# Patient Record
Sex: Female | Born: 1945 | Race: Black or African American | Hispanic: No | Marital: Married | Smoking: Former smoker
Health system: Southern US, Community
[De-identification: ages and names within clinical notes are randomized; demographics above are authoritative.]

## PROBLEM LIST (undated history)

## (undated) DIAGNOSIS — Z8619 Personal history of other infectious and parasitic diseases: Secondary | ICD-10-CM

## (undated) DIAGNOSIS — G5600 Carpal tunnel syndrome, unspecified upper limb: Secondary | ICD-10-CM

## (undated) DIAGNOSIS — Z86010 Personal history of colonic polyps: Secondary | ICD-10-CM

## (undated) DIAGNOSIS — B029 Zoster without complications: Secondary | ICD-10-CM

## (undated) DIAGNOSIS — E079 Disorder of thyroid, unspecified: Secondary | ICD-10-CM

## (undated) DIAGNOSIS — E559 Vitamin D deficiency, unspecified: Secondary | ICD-10-CM

## (undated) DIAGNOSIS — D219 Benign neoplasm of connective and other soft tissue, unspecified: Secondary | ICD-10-CM

## (undated) DIAGNOSIS — I10 Essential (primary) hypertension: Secondary | ICD-10-CM

## (undated) DIAGNOSIS — J349 Unspecified disorder of nose and nasal sinuses: Secondary | ICD-10-CM

## (undated) DIAGNOSIS — J45909 Unspecified asthma, uncomplicated: Secondary | ICD-10-CM

## (undated) DIAGNOSIS — E119 Type 2 diabetes mellitus without complications: Secondary | ICD-10-CM

## (undated) DIAGNOSIS — B019 Varicella without complication: Secondary | ICD-10-CM

## (undated) HISTORY — DX: Varicella without complication: B01.9

## (undated) HISTORY — DX: Hypercalcemia: E83.52

## (undated) HISTORY — PX: TUBAL LIGATION: SHX77

## (undated) HISTORY — PX: OTHER SURGICAL HISTORY: SHX169

## (undated) HISTORY — DX: Unspecified disorder of nose and nasal sinuses: J34.9

## (undated) HISTORY — PX: EYE SURGERY: SHX253

## (undated) HISTORY — DX: Vitamin D deficiency, unspecified: E55.9

## (undated) HISTORY — DX: Disorder of thyroid, unspecified: E07.9

## (undated) HISTORY — DX: Carpal tunnel syndrome, unspecified upper limb: G56.00

## (undated) HISTORY — DX: Zoster without complications: B02.9

## (undated) HISTORY — DX: Unspecified asthma, uncomplicated: J45.909

## (undated) HISTORY — PX: CATARACT EXTRACTION: SUR2

## (undated) HISTORY — DX: Type 2 diabetes mellitus without complications: E11.9

## (undated) HISTORY — DX: Essential (primary) hypertension: I10

## (undated) HISTORY — DX: Personal history of colonic polyps: Z86.010

## (undated) HISTORY — DX: Personal history of other infectious and parasitic diseases: Z86.19

## (undated) HISTORY — DX: Benign neoplasm of connective and other soft tissue, unspecified: D21.9

---

## 2004-06-03 ENCOUNTER — Ambulatory Visit: Payer: Self-pay | Admitting: Orthopaedic Surgery

## 2005-02-23 ENCOUNTER — Ambulatory Visit: Payer: Self-pay | Admitting: Family Medicine

## 2006-06-08 ENCOUNTER — Ambulatory Visit: Payer: Self-pay | Admitting: Family Medicine

## 2006-06-20 ENCOUNTER — Ambulatory Visit: Payer: Self-pay | Admitting: Family Medicine

## 2007-08-18 ENCOUNTER — Ambulatory Visit: Payer: Self-pay | Admitting: General Surgery

## 2007-12-04 ENCOUNTER — Ambulatory Visit: Payer: Self-pay

## 2008-09-23 ENCOUNTER — Inpatient Hospital Stay: Payer: Self-pay | Admitting: Specialist

## 2008-09-23 ENCOUNTER — Other Ambulatory Visit: Payer: Self-pay

## 2008-09-27 ENCOUNTER — Ambulatory Visit: Payer: Self-pay | Admitting: Internal Medicine

## 2009-01-01 ENCOUNTER — Ambulatory Visit: Payer: Self-pay

## 2011-10-22 ENCOUNTER — Ambulatory Visit: Payer: Self-pay | Admitting: Family Medicine

## 2013-04-11 ENCOUNTER — Encounter: Payer: Self-pay | Admitting: Podiatry

## 2013-04-16 ENCOUNTER — Ambulatory Visit: Payer: Self-pay | Admitting: Podiatry

## 2014-07-05 LAB — HM DEXA SCAN: HM DEXA SCAN: NORMAL

## 2014-07-05 LAB — HM PAP SMEAR: HM PAP: NEGATIVE

## 2014-09-13 ENCOUNTER — Other Ambulatory Visit: Payer: Self-pay | Admitting: Family Medicine

## 2015-02-05 DIAGNOSIS — H524 Presbyopia: Secondary | ICD-10-CM | POA: Diagnosis not present

## 2015-02-05 DIAGNOSIS — H52223 Regular astigmatism, bilateral: Secondary | ICD-10-CM | POA: Diagnosis not present

## 2015-02-05 DIAGNOSIS — I10 Essential (primary) hypertension: Secondary | ICD-10-CM | POA: Diagnosis not present

## 2015-02-05 DIAGNOSIS — H26013 Infantile and juvenile cortical, lamellar, or zonular cataract, bilateral: Secondary | ICD-10-CM | POA: Diagnosis not present

## 2015-02-05 DIAGNOSIS — H5203 Hypermetropia, bilateral: Secondary | ICD-10-CM | POA: Diagnosis not present

## 2015-02-05 DIAGNOSIS — E119 Type 2 diabetes mellitus without complications: Secondary | ICD-10-CM | POA: Diagnosis not present

## 2015-02-05 DIAGNOSIS — H2513 Age-related nuclear cataract, bilateral: Secondary | ICD-10-CM | POA: Diagnosis not present

## 2015-02-13 ENCOUNTER — Encounter: Admission: RE | Payer: Self-pay | Source: Ambulatory Visit

## 2015-02-13 ENCOUNTER — Ambulatory Visit: Admission: RE | Admit: 2015-02-13 | Payer: Medicare PPO | Source: Ambulatory Visit | Admitting: Gastroenterology

## 2015-02-13 SURGERY — COLONOSCOPY WITH PROPOFOL
Anesthesia: General

## 2015-02-16 ENCOUNTER — Other Ambulatory Visit: Payer: Self-pay | Admitting: Family Medicine

## 2015-03-20 ENCOUNTER — Other Ambulatory Visit: Payer: Self-pay | Admitting: Internal Medicine

## 2015-03-20 DIAGNOSIS — E559 Vitamin D deficiency, unspecified: Secondary | ICD-10-CM | POA: Diagnosis not present

## 2015-03-20 DIAGNOSIS — E669 Obesity, unspecified: Secondary | ICD-10-CM | POA: Diagnosis not present

## 2015-03-20 DIAGNOSIS — I10 Essential (primary) hypertension: Secondary | ICD-10-CM | POA: Diagnosis not present

## 2015-03-20 DIAGNOSIS — Z23 Encounter for immunization: Secondary | ICD-10-CM | POA: Diagnosis not present

## 2015-03-20 DIAGNOSIS — Z1231 Encounter for screening mammogram for malignant neoplasm of breast: Secondary | ICD-10-CM

## 2015-03-20 DIAGNOSIS — E785 Hyperlipidemia, unspecified: Secondary | ICD-10-CM | POA: Diagnosis not present

## 2015-03-20 DIAGNOSIS — E119 Type 2 diabetes mellitus without complications: Secondary | ICD-10-CM | POA: Diagnosis not present

## 2015-03-26 DIAGNOSIS — E559 Vitamin D deficiency, unspecified: Secondary | ICD-10-CM | POA: Diagnosis not present

## 2015-03-26 DIAGNOSIS — E119 Type 2 diabetes mellitus without complications: Secondary | ICD-10-CM | POA: Diagnosis not present

## 2015-03-26 DIAGNOSIS — I10 Essential (primary) hypertension: Secondary | ICD-10-CM | POA: Diagnosis not present

## 2015-03-26 DIAGNOSIS — E785 Hyperlipidemia, unspecified: Secondary | ICD-10-CM | POA: Diagnosis not present

## 2015-04-04 ENCOUNTER — Other Ambulatory Visit: Payer: Self-pay | Admitting: Internal Medicine

## 2015-04-04 ENCOUNTER — Ambulatory Visit
Admission: RE | Admit: 2015-04-04 | Discharge: 2015-04-04 | Disposition: A | Discharge status: Home / Self Care | Payer: Medicare PPO | Source: Ambulatory Visit | Attending: Internal Medicine | Admitting: Internal Medicine

## 2015-04-04 DIAGNOSIS — Z1231 Encounter for screening mammogram for malignant neoplasm of breast: Secondary | ICD-10-CM

## 2015-07-20 ENCOUNTER — Other Ambulatory Visit: Payer: Self-pay | Admitting: Family Medicine

## 2015-08-19 LAB — HM HEPATITIS C SCREENING LAB: HM HEPATITIS C SCREENING: NEGATIVE

## 2015-12-19 ENCOUNTER — Other Ambulatory Visit: Payer: Self-pay | Admitting: Internal Medicine

## 2015-12-19 DIAGNOSIS — Z136 Encounter for screening for cardiovascular disorders: Secondary | ICD-10-CM

## 2015-12-19 DIAGNOSIS — M79604 Pain in right leg: Secondary | ICD-10-CM

## 2015-12-19 DIAGNOSIS — M79605 Pain in left leg: Secondary | ICD-10-CM

## 2015-12-23 ENCOUNTER — Other Ambulatory Visit: Payer: Self-pay | Admitting: Internal Medicine

## 2015-12-23 DIAGNOSIS — I83813 Varicose veins of bilateral lower extremities with pain: Secondary | ICD-10-CM

## 2015-12-23 DIAGNOSIS — I8312 Varicose veins of left lower extremity with inflammation: Secondary | ICD-10-CM

## 2015-12-23 DIAGNOSIS — I8311 Varicose veins of right lower extremity with inflammation: Secondary | ICD-10-CM

## 2015-12-23 DIAGNOSIS — M79604 Pain in right leg: Secondary | ICD-10-CM

## 2015-12-23 DIAGNOSIS — Z136 Encounter for screening for cardiovascular disorders: Secondary | ICD-10-CM

## 2015-12-23 DIAGNOSIS — M79605 Pain in left leg: Principal | ICD-10-CM

## 2015-12-24 ENCOUNTER — Ambulatory Visit
Admission: RE | Admit: 2015-12-24 | Discharge: 2015-12-24 | Disposition: A | Discharge status: Home / Self Care | Payer: Medicare Other | Source: Ambulatory Visit | Attending: Internal Medicine | Admitting: Internal Medicine

## 2015-12-24 ENCOUNTER — Ambulatory Visit: Admission: RE | Admit: 2015-12-24 | Payer: Medicare Other | Source: Ambulatory Visit

## 2015-12-24 ENCOUNTER — Other Ambulatory Visit: Payer: Self-pay | Admitting: Internal Medicine

## 2015-12-24 DIAGNOSIS — Z136 Encounter for screening for cardiovascular disorders: Secondary | ICD-10-CM

## 2015-12-24 DIAGNOSIS — I8311 Varicose veins of right lower extremity with inflammation: Secondary | ICD-10-CM

## 2015-12-24 DIAGNOSIS — I83813 Varicose veins of bilateral lower extremities with pain: Secondary | ICD-10-CM

## 2015-12-24 DIAGNOSIS — M79605 Pain in left leg: Secondary | ICD-10-CM | POA: Diagnosis present

## 2015-12-24 DIAGNOSIS — I8312 Varicose veins of left lower extremity with inflammation: Secondary | ICD-10-CM | POA: Insufficient documentation

## 2015-12-24 DIAGNOSIS — M79604 Pain in right leg: Secondary | ICD-10-CM | POA: Diagnosis present

## 2015-12-24 DIAGNOSIS — N281 Cyst of kidney, acquired: Secondary | ICD-10-CM | POA: Diagnosis not present

## 2015-12-24 DIAGNOSIS — I77811 Abdominal aortic ectasia: Secondary | ICD-10-CM | POA: Diagnosis not present

## 2015-12-24 DIAGNOSIS — I159 Secondary hypertension, unspecified: Secondary | ICD-10-CM

## 2016-01-08 LAB — COLOGUARD: COLOGUARD: NEGATIVE

## 2016-04-15 ENCOUNTER — Other Ambulatory Visit: Payer: Self-pay | Admitting: Internal Medicine

## 2016-04-15 DIAGNOSIS — Z1231 Encounter for screening mammogram for malignant neoplasm of breast: Secondary | ICD-10-CM

## 2016-05-04 ENCOUNTER — Encounter: Payer: Self-pay | Admitting: Sports Medicine

## 2016-05-04 ENCOUNTER — Ambulatory Visit (INDEPENDENT_AMBULATORY_CARE_PROVIDER_SITE_OTHER): Payer: Medicare Other | Admitting: Sports Medicine

## 2016-05-04 DIAGNOSIS — B029 Zoster without complications: Secondary | ICD-10-CM | POA: Insufficient documentation

## 2016-05-04 DIAGNOSIS — E7800 Pure hypercholesterolemia, unspecified: Secondary | ICD-10-CM | POA: Insufficient documentation

## 2016-05-04 DIAGNOSIS — Z1239 Encounter for other screening for malignant neoplasm of breast: Secondary | ICD-10-CM | POA: Insufficient documentation

## 2016-05-04 DIAGNOSIS — Z9229 Personal history of other drug therapy: Secondary | ICD-10-CM | POA: Insufficient documentation

## 2016-05-04 DIAGNOSIS — D259 Leiomyoma of uterus, unspecified: Secondary | ICD-10-CM | POA: Insufficient documentation

## 2016-05-04 DIAGNOSIS — L84 Corns and callosities: Secondary | ICD-10-CM | POA: Insufficient documentation

## 2016-05-04 DIAGNOSIS — M79672 Pain in left foot: Secondary | ICD-10-CM

## 2016-05-04 DIAGNOSIS — E669 Obesity, unspecified: Secondary | ICD-10-CM | POA: Insufficient documentation

## 2016-05-04 DIAGNOSIS — J069 Acute upper respiratory infection, unspecified: Secondary | ICD-10-CM | POA: Insufficient documentation

## 2016-05-04 DIAGNOSIS — E119 Type 2 diabetes mellitus without complications: Secondary | ICD-10-CM | POA: Diagnosis not present

## 2016-05-04 DIAGNOSIS — R0781 Pleurodynia: Secondary | ICD-10-CM | POA: Insufficient documentation

## 2016-05-04 DIAGNOSIS — E559 Vitamin D deficiency, unspecified: Secondary | ICD-10-CM | POA: Insufficient documentation

## 2016-05-04 DIAGNOSIS — J45909 Unspecified asthma, uncomplicated: Secondary | ICD-10-CM | POA: Insufficient documentation

## 2016-05-04 DIAGNOSIS — M79671 Pain in right foot: Secondary | ICD-10-CM

## 2016-05-04 DIAGNOSIS — J309 Allergic rhinitis, unspecified: Secondary | ICD-10-CM | POA: Insufficient documentation

## 2016-05-04 DIAGNOSIS — Z23 Encounter for immunization: Secondary | ICD-10-CM | POA: Insufficient documentation

## 2016-05-04 DIAGNOSIS — K219 Gastro-esophageal reflux disease without esophagitis: Secondary | ICD-10-CM | POA: Insufficient documentation

## 2016-05-04 DIAGNOSIS — B369 Superficial mycosis, unspecified: Secondary | ICD-10-CM | POA: Insufficient documentation

## 2016-05-04 DIAGNOSIS — J40 Bronchitis, not specified as acute or chronic: Secondary | ICD-10-CM | POA: Insufficient documentation

## 2016-05-04 DIAGNOSIS — R600 Localized edema: Secondary | ICD-10-CM | POA: Insufficient documentation

## 2016-05-04 DIAGNOSIS — I10 Essential (primary) hypertension: Secondary | ICD-10-CM | POA: Insufficient documentation

## 2016-05-04 DIAGNOSIS — B351 Tinea unguium: Secondary | ICD-10-CM | POA: Diagnosis not present

## 2016-05-04 DIAGNOSIS — R609 Edema, unspecified: Secondary | ICD-10-CM | POA: Insufficient documentation

## 2016-05-04 NOTE — Progress Notes (Signed)
Subjective: Tracy Torres is a 71 y.o. female patient seen today in office with complaint of painful thickened and discolored nails. Patient is desiring treatment for nail changes especially at right 1st toe; has tried OTC topicals/Lamisil Medication in the past with no improvement. Reports that nails are becoming difficult to manage because of the thickness. Patient has no other pedal complaints at this time.   Patient is also diabetic with last A1c 6.3.   Patient Active Problem List   Diagnosis Date Noted  . Allergic rhinitis 05/04/2016  . Airway hyperreactivity 05/04/2016  . AB (asthmatic bronchitis) 05/04/2016  . Screening breast examination 05/04/2016  . Bronchitis 05/04/2016  . Cutaneous fungal infection 05/04/2016  . Leiomyoma of uterus 05/04/2016  . Callus of foot 05/04/2016  . Esophageal reflux 05/04/2016  . Herpes zona 05/04/2016  . Calcium blood increased 05/04/2016  . Hypercholesteremia 05/04/2016  . BP (high blood pressure) 05/04/2016  . Received influenza vaccination at hospital 05/04/2016  . Adiposity 05/04/2016  . Edema, peripheral 05/04/2016  . Pneumococcal vaccination given 05/04/2016  . Pain in rib 05/04/2016  . Diabetes mellitus, type 2 ([place]) 05/04/2016  . Infection of the upper respiratory tract 05/04/2016  . Avitaminosis D 05/04/2016    Current Outpatient Prescriptions on File Prior to Visit  Medication Sig Dispense Refill  . ACCU-CHEK SMARTVIEW test strip USE 1 STRIP DAILY 100 each 5  . ADVAIR DISKUS 250-50 MCG/DOSE AEPB INHALE 1 PUFF VIA INHALER TWO TIMES PER DAY, IN THE MORNING AND EVENING, APPROXIMATELY 12 HOURS APAR 60 each 0  . albuterol (PROVENTIL HFA;VENTOLIN HFA) 108 (90 BASE) MCG/ACT inhaler Inhale 2 puffs into the lungs every 4 (four) hours as needed for wheezing or shortness of breath.    . Ascorbic Acid (VITAMIN C PO) Take 1 tablet by mouth daily.     Marland Kitchen atorvastatin (LIPITOR) 80 MG tablet Take 1 tablet by mouth daily.  5  . cholecalciferol  (VITAMIN D) 1000 UNITS tablet Take 2,000 Units by mouth daily.    Marland Kitchen ezetimibe-simvastatin (VYTORIN) 10-40 MG per tablet Take 1 tablet by mouth once.    . gabapentin (NEURONTIN) 300 MG capsule Take 300 mg by mouth 3 (three) times daily as needed (pain).   5  . metFORMIN (GLUCOPHAGE) 1000 MG tablet Take 1 tablet by mouth 2 (two) times daily.  5  . montelukast (SINGULAIR) 10 MG tablet TAKE 1 TABLET EVERY DAY 30 tablet 6  . valsartan-hydrochlorothiazide (DIOVAN-HCT) 320-25 MG per tablet Take 1 tablet by mouth daily.  5   No current facility-administered medications on file prior to visit.     No Known Allergies  Objective: Physical Exam  General: Well developed, nourished, no acute distress, awake, alert and oriented x 3  Vascular: Dorsalis pedis artery 1/4 bilateral, Posterior tibial artery 2/4 bilateral, skin temperature warm to warm proximal to distal bilateral lower extremities, mild varicosities, pedal hair present bilateral.  Neurological: Gross sensation present via light touch bilateral.   Dermatological: Skin is warm, dry, and supple bilateral, Nails 1-10 are tender, short thick, and discolored with mild subungal debris with right 1st toenail most involved, no webspace macerations present bilateral, no open lesions present bilateral, no callus/corns/hyperkeratotic tissue present bilateral. +Dry skin plantarlly. No signs of infection bilateral.  Musculoskeletal: Asymptomatic bunion boney deformities noted bilateral. Muscular strength within normal limits without painon range of motion. No pain with calf compression bilateral.  Assessment and Plan:  Problem List Items Addressed This Visit    None    Visit  Diagnoses    Dermatophytosis of nail    -  Primary   Relevant Orders   Culture, fungus without smear   Diabetes mellitus without complication ([place])       Foot pain, bilateral          -Examined patient -Discussed treatment options for painful dystrophic nails  -Fungal  culture was obtained and sent to [facility] lab -Recommend vinegar soaks and good hygiene habits -Recommend okeeffe healthy feet daily under oculsion  -Patient to return in 4 weeks for follow up evaluation and discussion of fungal culture results or sooner if symptoms worsen.  Landis Martins, DPM

## 2016-05-04 NOTE — Patient Instructions (Addendum)
Okeeffe healthy feet daily  Vinegar soaks 1 cup to 8 cups of warm water weekly

## 2016-05-14 ENCOUNTER — Ambulatory Visit
Admission: RE | Admit: 2016-05-14 | Discharge: 2016-05-14 | Disposition: A | Discharge status: Home / Self Care | Payer: Medicare Other | Source: Ambulatory Visit | Attending: Internal Medicine | Admitting: Internal Medicine

## 2016-05-14 DIAGNOSIS — Z1231 Encounter for screening mammogram for malignant neoplasm of breast: Secondary | ICD-10-CM | POA: Diagnosis not present

## 2016-06-01 ENCOUNTER — Ambulatory Visit: Payer: Medicare Other | Admitting: Sports Medicine

## 2017-03-01 ENCOUNTER — Telehealth: Payer: Self-pay | Admitting: *Deleted

## 2017-03-01 NOTE — Telephone Encounter (Signed)
Copied from [place]. Topic: Appointment Scheduling - Scheduling Inquiry for Clinic >> Mar 01, 2017 12:07 PM Tracy Torres wrote: Reason for CRM: Pt called to make an apt with Dr Mclean-Scocuzza, the The pt has not been seen in the office before so  I was going to set her up as a new pt , when I told her the first available new pt apt was in dec  she told me she wasn't a new pt to Dr Terese Door,  that she saw her at the [facility] office.   I tried to contact someone from the office to see if I could schedule her a ov without going through new pt appointment but could not reach anyone. Pt could not wait on phone any longer as she had a class. Told pt I would send message to office and someone would contact her back.  She can be reached after 1:45 today on work number

## 2017-03-25 ENCOUNTER — Ambulatory Visit: Payer: Medicare PPO | Admitting: Internal Medicine

## 2017-04-06 ENCOUNTER — Ambulatory Visit: Payer: Medicare PPO | Admitting: Internal Medicine

## 2017-05-02 ENCOUNTER — Ambulatory Visit: Payer: Medicare PPO | Admitting: Internal Medicine

## 2017-05-02 ENCOUNTER — Ambulatory Visit: Payer: Medicare Other | Admitting: Internal Medicine

## 2017-05-02 ENCOUNTER — Encounter: Payer: Self-pay | Admitting: Internal Medicine

## 2017-05-02 VITALS — BP 150/88 | HR 88 | Temp 98.2°F | Resp 18 | Ht 58.5 in | Wt 171.0 lb

## 2017-05-02 DIAGNOSIS — J309 Allergic rhinitis, unspecified: Secondary | ICD-10-CM

## 2017-05-02 DIAGNOSIS — I10 Essential (primary) hypertension: Secondary | ICD-10-CM

## 2017-05-02 DIAGNOSIS — E559 Vitamin D deficiency, unspecified: Secondary | ICD-10-CM | POA: Diagnosis not present

## 2017-05-02 DIAGNOSIS — J4520 Mild intermittent asthma, uncomplicated: Secondary | ICD-10-CM

## 2017-05-02 DIAGNOSIS — Z1231 Encounter for screening mammogram for malignant neoplasm of breast: Secondary | ICD-10-CM | POA: Diagnosis not present

## 2017-05-02 DIAGNOSIS — E7800 Pure hypercholesterolemia, unspecified: Secondary | ICD-10-CM | POA: Diagnosis not present

## 2017-05-02 DIAGNOSIS — E119 Type 2 diabetes mellitus without complications: Secondary | ICD-10-CM | POA: Diagnosis not present

## 2017-05-02 MED ORDER — LOSARTAN POTASSIUM-HCTZ 100-25 MG PO TABS: 1.0000 | ORAL_TABLET | Freq: Every day | ORAL | 1 refills | Status: DC

## 2017-05-02 MED ORDER — ALBUTEROL SULFATE HFA 108 (90 BASE) MCG/ACT IN AERS: 1.0000 | INHALATION_SPRAY | Freq: Four times a day (QID) | RESPIRATORY_TRACT | 11 refills | Status: DC | PRN

## 2017-05-02 MED ORDER — AMLODIPINE BESYLATE 5 MG PO TABS: 5.0000 mg | ORAL_TABLET | Freq: Every day | ORAL | 1 refills | Status: DC

## 2017-05-02 NOTE — Patient Instructions (Signed)
Please follow up in 3-4 months labs fasting 05/13/17  Take care  I will refer you for mammogram  I need to update your med list after calling your pharmacy and will mail you a copy   Hypertension Hypertension, commonly called high blood pressure, is when the force of blood pumping through the arteries is too strong. The arteries are the blood vessels that carry blood from the heart throughout the body. Hypertension forces the heart to work harder to pump blood and may cause arteries to become narrow or stiff. Having untreated or uncontrolled hypertension can cause heart attacks, strokes, kidney disease, and other problems. A blood pressure reading consists of a higher number over a lower number. Ideally, your blood pressure should be below 120/80. The first ("top") number is called the systolic pressure. It is a measure of the pressure in your arteries as your heart beats. The second ("bottom") number is called the diastolic pressure. It is a measure of the pressure in your arteries as the heart relaxes. What are the causes? The cause of this condition is not known. What increases the risk? Some risk factors for high blood pressure are under your control. Others are not. Factors you can change  Smoking.  Having type 2 diabetes mellitus, high cholesterol, or both.  Not getting enough exercise or physical activity.  Being overweight.  Having too much fat, sugar, calories, or salt (sodium) in your diet.  Drinking too much alcohol. Factors that are difficult or impossible to change  Having chronic kidney disease.  Having a family history of high blood pressure.  Age. Risk increases with age.  Race. You may be at higher risk if you are African-American.  Gender. Men are at higher risk than women before age 40. After age 28, women are at higher risk than men.  Having obstructive sleep apnea.  Stress. What are the signs or symptoms? Extremely high blood pressure (hypertensive crisis)  may cause:  Headache.  Anxiety.  Shortness of breath.  Nosebleed.  Nausea and vomiting.  Severe chest pain.  Jerky movements you cannot control (seizures).  How is this diagnosed? This condition is diagnosed by measuring your blood pressure while you are seated, with your arm resting on a surface. The cuff of the blood pressure monitor will be placed directly against the skin of your upper arm at the level of your heart. It should be measured at least twice using the same arm. Certain conditions can cause a difference in blood pressure between your right and left arms. Certain factors can cause blood pressure readings to be lower or higher than normal (elevated) for a short period of time:  When your blood pressure is higher when you are in a health care provider's office than when you are at home, this is called white coat hypertension. Most people with this condition do not need medicines.  When your blood pressure is higher at home than when you are in a health care provider's office, this is called masked hypertension. Most people with this condition may need medicines to control blood pressure.  If you have a high blood pressure reading during one visit or you have normal blood pressure with other risk factors:  You may be asked to return on a different day to have your blood pressure checked again.  You may be asked to monitor your blood pressure at home for 1 week or longer.  If you are diagnosed with hypertension, you may have other blood or imaging tests  to help your health care provider understand your overall risk for other conditions. How is this treated? This condition is treated by making healthy lifestyle changes, such as eating healthy foods, exercising more, and reducing your alcohol intake. Your health care provider may prescribe medicine if lifestyle changes are not enough to get your blood pressure under control, and if:  Your systolic blood pressure is above  130.  Your diastolic blood pressure is above 80.  Your personal target blood pressure may vary depending on your medical conditions, your age, and other factors. Follow these instructions at home: Eating and drinking  Eat a diet that is high in fiber and potassium, and low in sodium, added sugar, and fat. An example eating plan is called the DASH (Dietary Approaches to Stop Hypertension) diet. To eat this way: ? Eat plenty of fresh fruits and vegetables. Try to fill half of your plate at each meal with fruits and vegetables. ? Eat whole grains, such as whole wheat pasta, brown rice, or whole grain bread. Fill about one quarter of your plate with whole grains. ? Eat or drink low-fat dairy products, such as skim milk or low-fat yogurt. ? Avoid fatty cuts of meat, processed or cured meats, and poultry with skin. Fill about one quarter of your plate with lean proteins, such as fish, chicken without skin, beans, eggs, and tofu. ? Avoid premade and processed foods. These tend to be higher in sodium, added sugar, and fat.  Reduce your daily sodium intake. Most people with hypertension should eat less than 1,500 mg of sodium a day.  Limit alcohol intake to no more than 1 drink a day for nonpregnant women and 2 drinks a day for men. One drink equals 12 oz of beer, 5 oz of wine, or 1 oz of hard liquor. Lifestyle  Work with your health care provider to maintain a healthy body weight or to lose weight. Ask what an ideal weight is for you.  Get at least 30 minutes of exercise that causes your heart to beat faster (aerobic exercise) most days of the week. Activities may include walking, swimming, or biking.  Include exercise to strengthen your muscles (resistance exercise), such as pilates or lifting weights, as part of your weekly exercise routine. Try to do these types of exercises for 30 minutes at least 3 days a week.  Do not use any products that contain nicotine or tobacco, such as cigarettes and  e-cigarettes. If you need help quitting, ask your health care provider.  Monitor your blood pressure at home as told by your health care provider.  Keep all follow-up visits as told by your health care provider. This is important. Medicines  Take over-the-counter and prescription medicines only as told by your health care provider. Follow directions carefully. Blood pressure medicines must be taken as prescribed.  Do not skip doses of blood pressure medicine. Doing this puts you at risk for problems and can make the medicine less effective.  Ask your health care provider about side effects or reactions to medicines that you should watch for. Contact a health care provider if:  You think you are having a reaction to a medicine you are taking.  You have headaches that keep coming back (recurring).  You feel dizzy.  You have swelling in your ankles.  You have trouble with your vision. Get help right away if:  You develop a severe headache or confusion.  You have unusual weakness or numbness.  You feel faint.  You have severe pain in your chest or abdomen.  You vomit repeatedly.  You have trouble breathing. Summary  Hypertension is when the force of blood pumping through your arteries is too strong. If this condition is not controlled, it may put you at risk for serious complications.  Your personal target blood pressure may vary depending on your medical conditions, your age, and other factors. For most people, a normal blood pressure is less than 120/80.  Hypertension is treated with lifestyle changes, medicines, or a combination of both. Lifestyle changes include weight loss, eating a healthy, low-sodium diet, exercising more, and limiting alcohol. This information is not intended to replace advice given to you by your health care provider. Make sure you discuss any questions you have with your health care provider. Document Released: 03/22/2005 Document Revised: 02/18/2016  Document Reviewed: 02/18/2016 Elsevier Interactive Patient Education  [employer].

## 2017-05-04 ENCOUNTER — Encounter: Payer: Self-pay | Admitting: Internal Medicine

## 2017-05-04 MED ORDER — METFORMIN HCL 1000 MG PO TABS: 1000.0000 mg | ORAL_TABLET | Freq: Every day | ORAL | 1 refills | Status: DC

## 2017-05-04 MED ORDER — FLUTICASONE-SALMETEROL 250-50 MCG/DOSE IN AEPB: INHALATION_SPRAY | RESPIRATORY_TRACT | 5 refills | Status: DC

## 2017-05-04 MED ORDER — MONTELUKAST SODIUM 10 MG PO TABS: 10.0000 mg | ORAL_TABLET | Freq: Every day | ORAL | 3 refills | Status: DC

## 2017-05-04 MED ORDER — ATORVASTATIN CALCIUM 80 MG PO TABS: 80.0000 mg | ORAL_TABLET | Freq: Every day | ORAL | 3 refills | Status: DC

## 2017-05-04 NOTE — Progress Notes (Signed)
Chief Complaint  Patient presents with  . Establish Care   Establish care former [facility] pt Dr. Gayland Curry  1. HTN uncontrolled not had meds today norvasc 5 not picked up from pharmacy in a while and Diovan-HCT 320/25 has not had today  2. Asthma controlled on Advair, singulair prefers ventolin  3. DM 2 controlled on Metfomin 1000 mg qam but has not had in a while  4. Pt reports she has lost 10 lbs since ive seen her    Review of Systems  Constitutional: Positive for weight loss.       Down 10 lbs  HENT: Negative for hearing loss.   Eyes:       No vision problems   Respiratory: Negative for shortness of breath and wheezing.   Cardiovascular: Negative for chest pain.  Gastrointestinal: Negative for abdominal pain.  Genitourinary:       No urinary sxs   Musculoskeletal: Negative for falls.  Skin: Negative for rash.  Neurological: Negative for headaches.  Psychiatric/Behavioral: Negative for memory loss.   Past Medical History:  Diagnosis Date  . Asthma   . Chicken pox   . Diabetes ([place])   . Hypertension   . Sinus problem    Past Surgical History:  Procedure Laterality Date  . NO PAST SURGERIES     Family History  Problem Relation Age of Onset  . Diabetes Mother   . Heart disease Mother        MI  . Hypertension Mother   . Hyperlipidemia Mother   . Stroke Mother   . Alcohol abuse Father   . Cancer Father   . Hyperlipidemia Sister   . Hypertension Sister   . Alcohol abuse Brother   . Early death Brother   . Alcohol abuse Brother   . Cancer Brother   . Depression Brother   . Hyperlipidemia Brother   . Breast cancer Neg Hx    Social History   Socioeconomic History  . Marital status: Married    Spouse name: Not on file  . Number of children: Not on file  . Years of education: Not on file  . Highest education level: Not on file  Social Needs  . Financial resource strain: Not on file  . Food insecurity - worry: Not on file  . Food insecurity - inability: Not on  file  . Transportation needs - medical: Not on file  . Transportation needs - non-medical: Not on file  Occupational History  . Not on file  Tobacco Use  . Smoking status: Former Research scientist (life sciences)  . Smokeless tobacco: Never Used  Substance and Sexual Activity  . Alcohol use: No  . Drug use: No  . Sexual activity: Yes    Comment: husband  Other Topics Concern  . Not on file  Social History Narrative   1 year of college retired    Married    1 daughter    Current Meds  Medication Sig  . ACCU-CHEK SMARTVIEW test strip USE 1 STRIP DAILY  . atorvastatin (LIPITOR) 80 MG tablet Take 1 tablet (80 mg total) by mouth daily.  . Fluticasone-Salmeterol (ADVAIR DISKUS) 250-50 MCG/DOSE AEPB INHALE 1 PUFF VIA INHALER TWO TIMES PER DAY, IN THE MORNING AND EVENING, APPROXIMATELY 12 HOURS APART RINSE mouth after each use  . metFORMIN (GLUCOPHAGE) 1000 MG tablet Take 1 tablet (1,000 mg total) by mouth daily with breakfast.  . montelukast (SINGULAIR) 10 MG tablet Take 1 tablet (10 mg total) by mouth daily. At night  . [  DISCONTINUED] ADVAIR DISKUS 250-50 MCG/DOSE AEPB INHALE 1 PUFF VIA INHALER TWO TIMES PER DAY, IN THE MORNING AND EVENING, APPROXIMATELY 12 HOURS APAR  . [DISCONTINUED] atorvastatin (LIPITOR) 80 MG tablet Take 1 tablet by mouth daily.  . [DISCONTINUED] metFORMIN (GLUCOPHAGE) 1000 MG tablet Take 1,000 mg by mouth daily with breakfast.   . [DISCONTINUED] montelukast (SINGULAIR) 10 MG tablet TAKE 1 TABLET EVERY DAY   No Known Allergies No results found for this or any previous visit (from the past 2160 hour(s)). Objective  Body mass index is 35.13 kg/m. Wt Readings from Last 3 Encounters:  05/02/17 171 lb (77.6 kg)   Temp Readings from Last 3 Encounters:  05/02/17 98.2 F (36.8 C) (Oral)   BP Readings from Last 3 Encounters:  05/02/17 (!) 150/88   Pulse Readings from Last 3 Encounters:  05/02/17 88   O2 sat 92% room air   Physical Exam  Constitutional: She is oriented to person,  place, and time and well-developed, well-nourished, and in no distress.  HENT:  Head: Normocephalic and atraumatic.  Mouth/Throat: Oropharynx is clear and moist and mucous membranes are normal.  Eyes: Conjunctivae are normal. Pupils are equal, round, and reactive to light.  Cardiovascular: Normal rate, regular rhythm and normal heart sounds.  Pulmonary/Chest: Effort normal and breath sounds normal.  Abdominal: Soft. Bowel sounds are normal. There is no tenderness.  Neurological: She is alert and oriented to person, place, and time. Gait normal. Gait normal.  Skin: Skin is warm, dry and intact.  Psychiatric: Mood, memory, affect and judgment normal.  Nursing note and vitals reviewed.   Assessment   1. HTN uncontrolled not had meds today 2. DM 2 controlled  3 .HLD  4. Asthma controlled  5. HM Plan  1.  D/c valsartan 320/hct 25 due to recall and change to losartan 100/hctz 25 Add back norvasc 5 mg  Given [facility] form to get CMET, CBC, lipid, UA, A1C, TSH, vit D (h/o vit D def) at [facility] outside pt will go 05/13/17  Will need to review prior records to see when urine microAlb/Cr due  F/u in 3-4 months   2.  Cont Metformin 1000 mg qam on ACEI, statin  Dr. Gertie Baron is eye MD appt sch end of the month Need to do foot exam at f/u  Consider urine protein in future  3. Cont lipitor 80 mg qhs  4. Advair, prefers ventolin prn, singulair  5.  Flu per pt had 12/2016 pharmacy records indicate 12/2015  prevnar 12/18/15  Pneumonia 23 need to check [facility] records  Tdap need to check [facility] records  shingrix disc at f/u   Out of age window pap  mammo due 05/14/17 ordered  Colonoscopy with h/o colon polyps need to get [facility] records colonoscopy 08/18/07 previously referred but I believe she did not f/u with colonoscopy address at f/u  DEXA none on file consider in the future review [facility] records 1st.  Former smoker from age 64 to 43 max 2 ppd no FH lung cancer. Calc CT chest risk score.    Wearing seatbelt, feeling safe in relationship, no guns at home   Pending records from [facility]  Provider: Dr. Olivia Mackie McLean-Scocuzza-Internal Medicine

## 2017-05-06 ENCOUNTER — Telehealth: Payer: Self-pay

## 2017-05-06 NOTE — Telephone Encounter (Signed)
Copied from [place] [id number]. Topic: General - Other >> May 06, 2017  1:19 PM Carolyn Stare wrote:  Pt call to say she is wheezing a lot more than she was when she saw Dr Mclean-Scocuzza. She is calling to ask if predisone and a antibiotic can be called in   [place]

## 2017-05-10 ENCOUNTER — Other Ambulatory Visit: Payer: Self-pay | Admitting: Internal Medicine

## 2017-05-10 DIAGNOSIS — J4521 Mild intermittent asthma with (acute) exacerbation: Secondary | ICD-10-CM

## 2017-05-10 MED ORDER — PREDNISONE 20 MG PO TABS: 40.0000 mg | ORAL_TABLET | Freq: Every day | ORAL | 0 refills | Status: DC

## 2017-05-10 MED ORDER — AZITHROMYCIN 250 MG PO TABS: ORAL_TABLET | ORAL | 0 refills | Status: DC

## 2017-05-10 NOTE — Telephone Encounter (Signed)
Spoke with patient advised note didn't get routed my error.   She stated not wheezing has been using inhaler feeling better still using wheezing at night.  Would not like medication called in now.

## 2017-05-10 NOTE — Telephone Encounter (Signed)
Advise pt I want her to call back if not better sent Abx and prednisone  We need to consider CT chest in the near future to look at lungs  Is she agreeable to this?  Thanks [employer]

## 2017-05-11 ENCOUNTER — Encounter: Payer: Self-pay | Admitting: Internal Medicine

## 2017-05-11 LAB — HM DIABETES EYE EXAM

## 2017-05-11 NOTE — Telephone Encounter (Signed)
Left message to return call, see staff message also

## 2017-05-11 NOTE — Telephone Encounter (Signed)
Patient advised of below , she states she is feeling little worse today and going out of town today.  Will pick up script today.

## 2017-05-16 ENCOUNTER — Encounter: Payer: Self-pay | Admitting: General Surgery

## 2017-05-16 ENCOUNTER — Telehealth: Payer: Self-pay | Admitting: *Deleted

## 2017-05-16 NOTE — Telephone Encounter (Signed)
Please advise 

## 2017-05-16 NOTE — Telephone Encounter (Signed)
Copied from [place]. Topic: General - Other >> May 16, 2017 11:05 AM Synthia Innocent wrote: Reason for CRM: checking status of check?

## 2017-05-16 NOTE — Telephone Encounter (Signed)
Pt calling back and stated she is checking status on a check for her to pick up? She said she left some tickets on Friday but need to turn the money in today?

## 2017-05-18 ENCOUNTER — Other Ambulatory Visit: Payer: Self-pay | Admitting: Internal Medicine

## 2017-05-19 LAB — COMPREHENSIVE METABOLIC PANEL
ALBUMIN: 4.1 g/dL (ref 3.5–4.8)
ALK PHOS: 72 IU/L (ref 39–117)
ALT: 16 IU/L (ref 0–32)
AST: 13 IU/L (ref 0–40)
Albumin/Globulin Ratio: 1.4 (ref 1.2–2.2)
BUN / CREAT RATIO: 16 (ref 12–28)
BUN: 14 mg/dL (ref 8–27)
Bilirubin Total: 0.2 mg/dL (ref 0.0–1.2)
CO2: 25 mmol/L (ref 20–29)
Calcium: 10.3 mg/dL (ref 8.7–10.3)
Chloride: 96 mmol/L (ref 96–106)
Creatinine, Ser: 0.9 mg/dL (ref 0.57–1.00)
GFR calc Af Amer: 74 mL/min/{1.73_m2} (ref >59)
GFR calc non Af Amer: 65 mL/min/{1.73_m2} (ref >59)
GLUCOSE: 86 mg/dL (ref 65–99)
Globulin, Total: 3 g/dL (ref 1.5–4.5)
Potassium: 4.4 mmol/L (ref 3.5–5.2)
SODIUM: 137 mmol/L (ref 134–144)
Total Protein: 7.1 g/dL (ref 6.0–8.5)

## 2017-05-19 LAB — URINALYSIS, ROUTINE W REFLEX MICROSCOPIC
Bilirubin, UA: NEGATIVE
Glucose, UA: NEGATIVE
Ketones, UA: NEGATIVE
Nitrite, UA: NEGATIVE
PH UA: 6 (ref 5.0–7.5)
Protein, UA: NEGATIVE
RBC UA: NEGATIVE
Specific Gravity, UA: 1.025 (ref 1.005–1.030)
Urobilinogen, Ur: 1 mg/dL (ref 0.2–1.0)

## 2017-05-19 LAB — MICROSCOPIC EXAMINATION: CASTS: NONE SEEN /LPF

## 2017-05-19 LAB — CBC WITH DIFFERENTIAL/PLATELET
BASOS ABS: 0 10*3/uL (ref 0.0–0.2)
Basos: 1 %
EOS (ABSOLUTE): 0.3 10*3/uL (ref 0.0–0.4)
Eos: 6 %
HEMATOCRIT: 43 % (ref 34.0–46.6)
Hemoglobin: 14.4 g/dL (ref 11.1–15.9)
Immature Grans (Abs): 0 10*3/uL (ref 0.0–0.1)
Immature Granulocytes: 0 %
LYMPHS ABS: 1.9 10*3/uL (ref 0.7–3.1)
Lymphs: 31 %
MCH: 29.5 pg (ref 26.6–33.0)
MCHC: 33.5 g/dL (ref 31.5–35.7)
MCV: 88 fL (ref 79–97)
MONOS ABS: 0.5 10*3/uL (ref 0.1–0.9)
Monocytes: 9 %
NEUTROS ABS: 3.2 10*3/uL (ref 1.4–7.0)
Neutrophils: 53 %
Platelets: 382 10*3/uL — ABNORMAL HIGH (ref 150–379)
RBC: 4.88 x10E6/uL (ref 3.77–5.28)
RDW: 14.8 % (ref 12.3–15.4)
WBC: 6 10*3/uL (ref 3.4–10.8)

## 2017-05-19 LAB — LIPID PANEL W/O CHOL/HDL RATIO
CHOLESTEROL TOTAL: 138 mg/dL (ref 100–199)
HDL: 39 mg/dL — ABNORMAL LOW (ref >39)
LDL CALC: 77 mg/dL (ref 0–99)
Triglycerides: 111 mg/dL (ref 0–149)
VLDL Cholesterol Cal: 22 mg/dL (ref 5–40)

## 2017-05-19 LAB — TSH: TSH: 5.19 u[IU]/mL — ABNORMAL HIGH (ref 0.450–4.500)

## 2017-05-19 LAB — VITAMIN D 25 HYDROXY (VIT D DEFICIENCY, FRACTURES): VIT D 25 HYDROXY: 37.8 ng/mL (ref 30.0–100.0)

## 2017-05-19 LAB — HGB A1C W/O EAG: Hgb A1c MFr Bld: 6.4 % — ABNORMAL HIGH (ref 4.8–5.6)

## 2017-05-20 ENCOUNTER — Other Ambulatory Visit: Payer: Self-pay | Admitting: Internal Medicine

## 2017-05-20 DIAGNOSIS — R946 Abnormal results of thyroid function studies: Secondary | ICD-10-CM

## 2017-05-23 ENCOUNTER — Other Ambulatory Visit: Payer: Self-pay | Admitting: Internal Medicine

## 2017-05-23 NOTE — Progress Notes (Signed)
Reviewed [facility]  PMH:  HTN/HLD DM 2 since 2010 -eye MD Dr. Reed Breech [facility] eye exam 12/30/15 neg  Asthma  Vit D def  H/o shingles  H/o abnormal thyroid labs elevated TSH 5.740 07/2014 Dr. Dionisio David  H/o elevated uric acid 7.9 07/2014 H/o colon polyps  H/o CTS  H/o hypercalcemia (HCTZ and calcium stopped 07/2013 and normalized)  H/o fibroid tumor  Former smoker quit age 72 y.o  Quant gold TB neg 12/18/15   PsuH  Tubal ligation  Left arm surgery age 64/12  FH  DM HTN/HLD  Stroke, heart dz   Vaccines  prevnar 12/18/15  Will need to check on Shingrix and pna 23 vaccines and Tdap  Hep B vaccine rec in the past hep B ab qualitative neg 12/18/15    cologuard negative 01/08/16 [facility] records  Pap neg 07/2014 [facility]  Hep C negative 08/19/2015 DEXA 07/2014 normal

## 2017-05-24 LAB — SPECIMEN STATUS REPORT

## 2017-05-25 ENCOUNTER — Telehealth: Payer: Self-pay | Admitting: Internal Medicine

## 2017-05-25 ENCOUNTER — Other Ambulatory Visit: Payer: Self-pay | Admitting: Internal Medicine

## 2017-05-25 LAB — T3, FREE: T3 FREE: 2.6 pg/mL (ref 2.0–4.4)

## 2017-05-25 LAB — THYROID PEROXIDASE ANTIBODY: THYROID PEROXIDASE ANTIBODY: 7 [IU]/mL (ref 0–34)

## 2017-05-25 LAB — T4, FREE: Free T4: 0.97 ng/dL (ref 0.82–1.77)

## 2017-05-25 LAB — SPECIMEN STATUS REPORT

## 2017-05-25 NOTE — Telephone Encounter (Signed)
Pt given results per Dr Jenell Milliner, "  Platelets slightly elevated need to monitor nl 379K hers 382K; Liver kidneys nl; Urine trace white blood cells likely contaminant; Cholesterol #s nl HDL slightly low; A1C 6.4 great job TSH elevated adding on free T4, Free T3, anti TPO antibodies ;Vitamin D normal; pt verbalizes understanding.

## 2017-05-25 NOTE — Progress Notes (Signed)
Labs mild subclinical hypothyroidism monitor   [place]

## 2017-08-09 ENCOUNTER — Telehealth: Payer: Self-pay | Admitting: Internal Medicine

## 2017-08-09 NOTE — Telephone Encounter (Signed)
Copied from [place] [id number]. Topic: Quick Communication - Rx Refill/Question >> Aug 09, 2017  9:33 AM Tracy Torres wrote: Medication: pt called asking for refill on valsartan-hydrochlorothiazide (DIOVAN-HCT) 320-25 MG per tablet but this RX is discontinued. Pt states Dr. Terese Door sent in a new RX for losartan-hydrochlorothiazide (HYZAAR) 100-25 MG tablet because she was informed the valsartan was recalled. When she went to pick up the new medicine the pharmacy told her that her medication was not recalled and was fine to take. The losartan was never filled. Pt is asking if she should change to the losartan or if she should continue on the valsartan. Please call her to advise. Pt said she has voicemail and ok to leave detailed message for her. She works as a Oceanographer.   If valsartan please send in new [place], [place] [phone] (Phone) [phone] (Fax)

## 2017-08-09 NOTE — Telephone Encounter (Signed)
Please advise 

## 2017-08-10 ENCOUNTER — Other Ambulatory Visit: Payer: Self-pay | Admitting: Internal Medicine

## 2017-08-10 DIAGNOSIS — I10 Essential (primary) hypertension: Secondary | ICD-10-CM

## 2017-08-10 MED ORDER — LOSARTAN POTASSIUM-HCTZ 100-25 MG PO TABS: 1.0000 | ORAL_TABLET | Freq: Every day | ORAL | 1 refills | Status: DC

## 2017-08-10 NOTE — Telephone Encounter (Signed)
Valsartan was recalled and certain losartans were I sent a new Rx at her last visit but will resend today if she did not pick it up  Have her check with pharmacy and make sure her LOT is not recalled  She should be on hyzaar losartan-hctz 100/25 not valsartan hctz any longer  She should also be on norvasc for blood pressure   TMS

## 2017-08-10 NOTE — Telephone Encounter (Signed)
Called patient to verify if patient is taking Losartan-hctz 100/25 patient states that she has not picked it up yet but will.I informed her not to take the valsartan HCTZ and she states she is not and she is taking norvasc.

## 2017-08-31 ENCOUNTER — Ambulatory Visit: Payer: Medicare Other | Admitting: Internal Medicine

## 2017-08-31 DIAGNOSIS — Z0289 Encounter for other administrative examinations: Secondary | ICD-10-CM

## 2017-09-08 ENCOUNTER — Telehealth: Payer: Self-pay | Admitting: Internal Medicine

## 2017-09-08 ENCOUNTER — Other Ambulatory Visit: Payer: Self-pay | Admitting: *Deleted

## 2017-09-08 MED ORDER — GLUCOSE BLOOD VI STRP: ORAL_STRIP | 5 refills | Status: DC

## 2017-09-08 NOTE — Telephone Encounter (Signed)
Copied from [place] [id number]. Topic: Quick Communication - Rx Refill/Question >> Sep 02, 2017 12:05 PM Percell Belt A wrote: Medication: Jerold Coombe test strip [[id number]]   Has the patient contacted their pharmacy? No (Agent: If no, request that the patient contact the pharmacy for the refill.) (Agent: If yes, when and what did the pharmacy advise?)  Preferred Pharmacy (with phone number or street name): [facility] in [place]  Agent: Please be advised that RX refills may take up to 3 business days. We ask that you follow-up with your pharmacy. >> Sep 07, 2017  4:15 PM Ivar Drape wrote: Patient is waiting on this prescription.  The pharmacy telephone number is [phone]    Pt is calling back and needs refill today

## 2017-09-08 NOTE — Telephone Encounter (Signed)
Rx for testing supplys renewed.

## 2017-10-13 ENCOUNTER — Ambulatory Visit: Payer: Medicare Other | Admitting: Internal Medicine

## 2017-10-13 ENCOUNTER — Encounter: Payer: Self-pay | Admitting: Internal Medicine

## 2017-10-13 ENCOUNTER — Ambulatory Visit (INDEPENDENT_AMBULATORY_CARE_PROVIDER_SITE_OTHER): Payer: Medicare Other

## 2017-10-13 VITALS — BP 148/88 | HR 84 | Temp 98.3°F | Ht 59.5 in | Wt 173.5 lb

## 2017-10-13 VITALS — BP 150/88 | HR 84 | Temp 98.3°F | Ht 59.5 in | Wt 173.8 lb

## 2017-10-13 DIAGNOSIS — Z720 Tobacco use: Secondary | ICD-10-CM

## 2017-10-13 DIAGNOSIS — E119 Type 2 diabetes mellitus without complications: Secondary | ICD-10-CM | POA: Diagnosis not present

## 2017-10-13 DIAGNOSIS — I10 Essential (primary) hypertension: Secondary | ICD-10-CM | POA: Diagnosis not present

## 2017-10-13 DIAGNOSIS — J4520 Mild intermittent asthma, uncomplicated: Secondary | ICD-10-CM | POA: Diagnosis not present

## 2017-10-13 DIAGNOSIS — F17211 Nicotine dependence, cigarettes, in remission: Secondary | ICD-10-CM | POA: Diagnosis not present

## 2017-10-13 DIAGNOSIS — Z1211 Encounter for screening for malignant neoplasm of colon: Secondary | ICD-10-CM

## 2017-10-13 DIAGNOSIS — D473 Essential (hemorrhagic) thrombocythemia: Secondary | ICD-10-CM | POA: Diagnosis not present

## 2017-10-13 DIAGNOSIS — R7989 Other specified abnormal findings of blood chemistry: Secondary | ICD-10-CM

## 2017-10-13 DIAGNOSIS — D75839 Thrombocytosis, unspecified: Secondary | ICD-10-CM

## 2017-10-13 DIAGNOSIS — Z1231 Encounter for screening mammogram for malignant neoplasm of breast: Secondary | ICD-10-CM

## 2017-10-13 DIAGNOSIS — Z0000 Encounter for general adult medical examination without abnormal findings: Secondary | ICD-10-CM

## 2017-10-13 DIAGNOSIS — E669 Obesity, unspecified: Secondary | ICD-10-CM | POA: Diagnosis not present

## 2017-10-13 MED ORDER — AMLODIPINE BESYLATE 10 MG PO TABS: 10.0000 mg | ORAL_TABLET | Freq: Every day | ORAL | 1 refills | Status: DC

## 2017-10-13 NOTE — Progress Notes (Signed)
Subjective:   Tracy Torres is a 72 y.o. female who presents for an Initial Medicare Annual Wellness Visit.  Review of Systems    No ROS.  Medicare Wellness Visit. Additional risk factors are reflected in the social history.  Cardiac Risk Factors include: advanced age (>5men, >68 women);hypertension;diabetes mellitus;obesity (BMI >30kg/m2)     Objective:    Today's Vitals   10/13/17 1435  BP: (!) 150/88  Pulse: 84  Temp: 98.3 F (36.8 C)  TempSrc: Oral  SpO2: 94%  Weight: 173 lb 12.8 oz (78.8 kg)  Height: 4' 11.5" (1.511 m)   Body mass index is 34.52 kg/m.  Advanced Directives 10/13/2017  Does Patient Have a Medical Advance Directive? No  Would patient like information on creating a medical advance directive? No - Patient declined    Current Medications (verified) Outpatient Encounter Medications as of 10/13/2017  Medication Sig  . albuterol (PROVENTIL HFA;VENTOLIN HFA) 108 (90 Base) MCG/ACT inhaler Inhale 1-2 puffs into the lungs every 6 (six) hours as needed for wheezing or shortness of breath.  Marland Kitchen amLODipine (NORVASC) 10 MG tablet Take 1 tablet (10 mg total) by mouth daily.  . Ascorbic Acid (VITAMIN C PO) Take 1 tablet by mouth daily.   Marland Kitchen atorvastatin (LIPITOR) 80 MG tablet Take 1 tablet (80 mg total) by mouth daily.  . cholecalciferol (VITAMIN D) 1000 UNITS tablet Take 2,000 Units by mouth daily.  . Fluticasone-Salmeterol (ADVAIR DISKUS) 250-50 MCG/DOSE AEPB INHALE 1 PUFF VIA INHALER TWO TIMES PER DAY, IN THE MORNING AND EVENING, APPROXIMATELY 12 HOURS APART RINSE mouth after each use  . glucose blood (ACCU-CHEK SMARTVIEW) test strip USE 1 STRIP DAILY  . losartan-hydrochlorothiazide (HYZAAR) 100-25 MG tablet Take 1 tablet by mouth daily.  . metFORMIN (GLUCOPHAGE) 1000 MG tablet Take 1 tablet (1,000 mg total) by mouth daily with breakfast.  . montelukast (SINGULAIR) 10 MG tablet Take 1 tablet (10 mg total) by mouth daily. At night   No facility-administered  encounter medications on file as of 10/13/2017.     Allergies (verified) Patient has no known allergies.   History: Past Medical History:  Diagnosis Date  . Asthma   . Chicken pox   . Diabetes ([place])   . Hypertension   . Sinus problem    Past Surgical History:  Procedure Laterality Date  . NO PAST SURGERIES     Family History  Problem Relation Age of Onset  . Diabetes Mother   . Heart disease Mother        MI  . Hypertension Mother   . Hyperlipidemia Mother   . Stroke Mother   . Alcohol abuse Father   . Cancer Father   . Hyperlipidemia Sister   . Hypertension Sister   . Alcohol abuse Brother   . Early death Brother   . Alcohol abuse Brother   . Cancer Brother   . Depression Brother   . Hyperlipidemia Brother   . Breast cancer Neg Hx    Social History   Socioeconomic History  . Marital status: Married    Spouse name: Not on file  . Number of children: Not on file  . Years of education: Not on file  . Highest education level: Not on file  Occupational History  . Not on file  Social Needs  . Financial resource strain: Not hard at all  . Food insecurity:    Worry: Never true    Inability: Never true  . Transportation needs:    Medical: No  Non-medical: No  Tobacco Use  . Smoking status: Former Research scientist (life sciences)  . Smokeless tobacco: Never Used  Substance and Sexual Activity  . Alcohol use: No  . Drug use: No  . Sexual activity: Yes    Comment: husband  Lifestyle  . Physical activity:    Days per week: 0 days    Minutes per session: Not on file  . Stress: Not at all  Relationships  . Social connections:    Talks on phone: Not on file    Gets together: Not on file    Attends religious service: Not on file    Active member of club or organization: Not on file    Attends meetings of clubs or organizations: Not on file    Relationship status: Not on file  Other Topics Concern  . 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 N  Vision? N  Difficulty concentrating or making decisions? N  Walking or climbing stairs? N  Dressing or bathing? N  Doing errands, shopping? N  Preparing Food and eating ? N  Using the Toilet? N  In the past six months, have you accidently leaked urine? N  Do you have problems with loss of bowel control? N  Managing your Medications? N  Managing your Finances? N  Housekeeping or managing your Housekeeping? N  Some recent data might be hidden     Immunizations and Health Maintenance  There is no immunization history on file for this patient. Health Maintenance Due  Topic Date Due  . Hepatitis C Screening  1945/09/25  . FOOT EXAM  08/03/1955  . TETANUS/TDAP  08/02/1964  . DEXA SCAN  08/03/2010  . PNA vac Low Risk Adult (1 of 2 - PCV13) 08/03/2010  . COLONOSCOPY  08/17/2017    Patient Care Team: McLean-Scocuzza, Nino Glow, MD as PCP - General (Internal Medicine)  Indicate any recent Medical Services you may have received from other than [facility] providers in the past year (date may be approximate).     Assessment:   This is a routine wellness examination for Tracy Torres.  The goal of the wellness visit is to assist the patient how to close the gaps in care and create a preventative care plan for the patient.   The roster of all physicians providing medical care to patient is listed in the Snapshot section of the  chart.  Taking calcium VIT D as appropriate/Osteoporosis risk reviewed.    Safety issues reviewed; Smoke and carbon monoxide detectors in the home. No firearms in the home. Wears seatbelts when driving or riding with others. No violence in the home.  They do not have excessive sun exposure.  Discussed the need for sun protection: hats, long sleeves and the use of sunscreen if there is significant sun exposure.  Patient is alert, normal appearance, oriented to person/place/and  time. Correctly identified the president of the [place] and recalls of 3/3 words.Performs simple calculations and can read correct time from watch face. Displays appropriate judgement.  No new identified risk were noted.  No failures at ADL's or IADL's.    BMI- discussed the importance of a healthy diet, water intake and the benefits of aerobic exercise. Educational material provided.   24 hour diet recall: Regular diet, portion control. Monitoring sodium intake.   Diabetes- followed by pcp.  She monitors her BS daily.  Average BS range 125.   Dental- every 6 months.  Eye- Visual acuity not assessed per patient preference since they have regular follow up with the ophthalmologist.  Wears corrective lenses.  Sleep patterns- Sleeps through the night without issues.   TDAP and pneumococcal vaccines deferred, awaiting records be sent to pcp.   Hepatitis c screening discussed.   Dexa Scan declined.   Colonoscopy-discussed with pcp; awaiting scheduling.   Patient Concerns: Profuse sweating with movement.  Ongoing since menopause. Deferred to pcp for follow up.    Hearing/Vision screen Hearing Screening Comments: Patient is able to hear conversational tones without difficulty.  No issues reported.   Vision Screening Comments: Annual visits Diabetic eye exam; no retinopathy reported Visual acuity not assessed per patient preference since they have regular follow up with the ophthalmologist  Dietary issues and  exercise activities discussed: Current Exercise Habits: The patient does not participate in regular exercise at present  Goals    . DIET - INCREASE LEAN PROTEINS     Low carb/diabetic diet, portion control Glucerna supplemental meal/snack replacement option; use additional coupons provided.     . Increase physical activity     Water aerobics 3 days a week, 45-29min. Educational material provided.       Depression Screen PHQ 2/9 Scores 10/13/2017  PHQ - 2 Score 0    Fall Risk Fall Risk  10/13/2017  Falls in the past year? No   Cognitive Function:     6CIT Screen 10/13/2017  What Year? 0 points  What month? 0 points  What time? 0 points  Count back from 20 0 points  Months in reverse 0 points  Repeat phrase 0 points  Total Score 0    Screening Tests Health Maintenance  Topic Date Due  . Hepatitis C Screening  1946-02-03  . FOOT EXAM  08/03/1955  . TETANUS/TDAP  08/02/1964  . DEXA SCAN  08/03/2010  . PNA vac Low Risk Adult (1 of 2 - PCV13) 08/03/2010  . COLONOSCOPY  08/17/2017  . INFLUENZA VACCINE  11/03/2017  . HEMOGLOBIN A1C  11/15/2017  . OPHTHALMOLOGY EXAM  05/11/2018  . MAMMOGRAM  05/14/2018     Plan:   End of life planning; Advanced aging; Advanced directives discussed.  No HCPOA/Living Will.  Additional information declined at this time.  I have personally reviewed and noted the following in the patient's chart:   . Medical and social history . Use of alcohol, tobacco or illicit drugs  . Current medications and supplements . Functional ability and status . Nutritional status . Physical activity . Advanced directives . List of other physicians . Hospitalizations, surgeries, and ER visits in previous 12 months . Vitals . Screenings to include cognitive, depression, and falls . Referrals and appointments  In addition, I have reviewed and discussed with patient certain preventive protocols, quality metrics, and best practice recommendations. A written  personalized care plan for preventive services as well as general preventive health recommendations were provided to  patient.     Varney Biles, LPN   [id number]

## 2017-10-13 NOTE — Patient Instructions (Addendum)
  Ms. Mino , Thank you for taking time to come for your Medicare Wellness Visit. I appreciate your ongoing commitment to your health goals. Please review the following plan we discussed and let me know if I can assist you in the future.   These are the goals we discussed: Goals    . DIET - INCREASE LEAN PROTEINS     Low carb/diabetic diet, portion control Glucerna supplemental meal/snack replacement option; use additional coupons provided.     . Increase physical activity     Water aerobics 3 days a week, 45-70min. Educational material provided.        This is a list of the screening recommended for you and due dates:  Health Maintenance  Topic Date Due  .  Hepatitis C: One time screening is recommended by Center for Disease Control  (CDC) for  adults born from [birth year] through 1965.   1946/03/15  . Complete foot exam   08/03/1955  . Tetanus Vaccine  08/02/1964  . DEXA scan (bone density measurement)  08/03/2010  . Pneumonia vaccines (1 of 2 - PCV13) 08/03/2010  . Colon Cancer Screening  08/17/2017  . Flu Shot  11/03/2017  . Hemoglobin A1C  11/15/2017  . Eye exam for diabetics  05/11/2018  . Mammogram  05/14/2018

## 2017-10-13 NOTE — Progress Notes (Signed)
Pre visit review using our clinic review tool, if applicable. No additional management support is needed unless otherwise documented below in the visit note. 

## 2017-10-13 NOTE — Patient Instructions (Addendum)
F/u in 3 months  Increased norvasc 5 mg to 10 mg daily in the am Take care  Hypertension Hypertension, commonly called high blood pressure, is when the force of blood pumping through the arteries is too strong. The arteries are the blood vessels that carry blood from the heart throughout the body. Hypertension forces the heart to work harder to pump blood and may cause arteries to become narrow or stiff. Having untreated or uncontrolled hypertension can cause heart attacks, strokes, kidney disease, and other problems. A blood pressure reading consists of a higher number over a lower number. Ideally, your blood pressure should be below 120/80. The first ("top") number is called the systolic pressure. It is a measure of the pressure in your arteries as your heart beats. The second ("bottom") number is called the diastolic pressure. It is a measure of the pressure in your arteries as the heart relaxes. What are the causes? The cause of this condition is not known. What increases the risk? Some risk factors for high blood pressure are under your control. Others are not. Factors you can change  Smoking.  Having type 2 diabetes mellitus, high cholesterol, or both.  Not getting enough exercise or physical activity.  Being overweight.  Having too much fat, sugar, calories, or salt (sodium) in your diet.  Drinking too much alcohol. Factors that are difficult or impossible to change  Having chronic kidney disease.  Having a family history of high blood pressure.  Age. Risk increases with age.  Race. You may be at higher risk if you are African-American.  Gender. Men are at higher risk than women before age 29. After age 73, women are at higher risk than men.  Having obstructive sleep apnea.  Stress. What are the signs or symptoms? Extremely high blood pressure (hypertensive crisis) may cause:  Headache.  Anxiety.  Shortness of breath.  Nosebleed.  Nausea and  vomiting.  Severe chest pain.  Jerky movements you cannot control (seizures).  How is this diagnosed? This condition is diagnosed by measuring your blood pressure while you are seated, with your arm resting on a surface. The cuff of the blood pressure monitor will be placed directly against the skin of your upper arm at the level of your heart. It should be measured at least twice using the same arm. Certain conditions can cause a difference in blood pressure between your right and left arms. Certain factors can cause blood pressure readings to be lower or higher than normal (elevated) for a short period of time:  When your blood pressure is higher when you are in a health care provider's office than when you are at home, this is called white coat hypertension. Most people with this condition do not need medicines.  When your blood pressure is higher at home than when you are in a health care provider's office, this is called masked hypertension. Most people with this condition may need medicines to control blood pressure.  If you have a high blood pressure reading during one visit or you have normal blood pressure with other risk factors:  You may be asked to return on a different day to have your blood pressure checked again.  You may be asked to monitor your blood pressure at home for 1 week or longer.  If you are diagnosed with hypertension, you may have other blood or imaging tests to help your health care provider understand your overall risk for other conditions. How is this treated? This condition is  treated by making healthy lifestyle changes, such as eating healthy foods, exercising more, and reducing your alcohol intake. Your health care provider may prescribe medicine if lifestyle changes are not enough to get your blood pressure under control, and if:  Your systolic blood pressure is above 130.  Your diastolic blood pressure is above 80.  Your personal target blood pressure  may vary depending on your medical conditions, your age, and other factors. Follow these instructions at home: Eating and drinking  Eat a diet that is high in fiber and potassium, and low in sodium, added sugar, and fat. An example eating plan is called the DASH (Dietary Approaches to Stop Hypertension) diet. To eat this way: ? Eat plenty of fresh fruits and vegetables. Try to fill half of your plate at each meal with fruits and vegetables. ? Eat whole grains, such as whole wheat pasta, brown rice, or whole grain bread. Fill about one quarter of your plate with whole grains. ? Eat or drink low-fat dairy products, such as skim milk or low-fat yogurt. ? Avoid fatty cuts of meat, processed or cured meats, and poultry with skin. Fill about one quarter of your plate with lean proteins, such as fish, chicken without skin, beans, eggs, and tofu. ? Avoid premade and processed foods. These tend to be higher in sodium, added sugar, and fat.  Reduce your daily sodium intake. Most people with hypertension should eat less than 1,500 mg of sodium a day.  Limit alcohol intake to no more than 1 drink a day for nonpregnant women and 2 drinks a day for men. One drink equals 12 oz of beer, 5 oz of wine, or 1 oz of hard liquor. Lifestyle  Work with your health care provider to maintain a healthy body weight or to lose weight. Ask what an ideal weight is for you.  Get at least 30 minutes of exercise that causes your heart to beat faster (aerobic exercise) most days of the week. Activities may include walking, swimming, or biking.  Include exercise to strengthen your muscles (resistance exercise), such as pilates or lifting weights, as part of your weekly exercise routine. Try to do these types of exercises for 30 minutes at least 3 days a week.  Do not use any products that contain nicotine or tobacco, such as cigarettes and e-cigarettes. If you need help quitting, ask your health care provider.  Monitor your  blood pressure at home as told by your health care provider.  Keep all follow-up visits as told by your health care provider. This is important. Medicines  Take over-the-counter and prescription medicines only as told by your health care provider. Follow directions carefully. Blood pressure medicines must be taken as prescribed.  Do not skip doses of blood pressure medicine. Doing this puts you at risk for problems and can make the medicine less effective.  Ask your health care provider about side effects or reactions to medicines that you should watch for. Contact a health care provider if:  You think you are having a reaction to a medicine you are taking.  You have headaches that keep coming back (recurring).  You feel dizzy.  You have swelling in your ankles.  You have trouble with your vision. Get help right away if:  You develop a severe headache or confusion.  You have unusual weakness or numbness.  You feel faint.  You have severe pain in your chest or abdomen.  You vomit repeatedly.  You have trouble breathing. Summary  Hypertension is when the force of blood pumping through your arteries is too strong. If this condition is not controlled, it may put you at risk for serious complications.  Your personal target blood pressure may vary depending on your medical conditions, your age, and other factors. For most people, a normal blood pressure is less than 120/80.  Hypertension is treated with lifestyle changes, medicines, or a combination of both. Lifestyle changes include weight loss, eating a healthy, low-sodium diet, exercising more, and limiting alcohol. This information is not intended to replace advice given to you by your health care provider. Make sure you discuss any questions you have with your health care provider. Document Released: 03/22/2005 Document Revised: 02/18/2016 Document Reviewed: 02/18/2016 Elsevier Interactive Patient Education  2018 [place] Eating Plan DASH stands for "Dietary Approaches to Stop Hypertension." The DASH eating plan is a healthy eating plan that has been shown to reduce high blood pressure (hypertension). It may also reduce your risk for type 2 diabetes, heart disease, and stroke. The DASH eating plan may also help with weight loss. What are tips for following this plan? General guidelines  Avoid eating more than 2,300 mg (milligrams) of salt (sodium) a day. If you have hypertension, you may need to reduce your sodium intake to 1,500 mg a day.  Limit alcohol intake to no more than 1 drink a day for nonpregnant women and 2 drinks a day for men. One drink equals 12 oz of beer, 5 oz of wine, or 1 oz of hard liquor.  Work with your health care provider to maintain a healthy body weight or to lose weight. Ask what an ideal weight is for you.  Get at least 30 minutes of exercise that causes your heart to beat faster (aerobic exercise) most days of the week. Activities may include walking, swimming, or biking.  Work with your health care provider or diet and nutrition specialist (dietitian) to adjust your eating plan to your individual calorie needs. Reading food labels  Check food labels for the amount of sodium per serving. Choose foods with less than 5 percent of the Daily Value of sodium. Generally, foods with less than 300 mg of sodium per serving fit into this eating plan.  To find whole grains, look for the word "whole" as the first word in the ingredient list. Shopping  Buy products labeled as "low-sodium" or "no salt added."  Buy fresh foods. Avoid canned foods and premade or frozen meals. Cooking  Avoid adding salt when cooking. Use salt-free seasonings or herbs instead of table salt or sea salt. Check with your health care provider or pharmacist before using salt substitutes.  Do not fry foods. Cook foods using healthy methods such as baking, boiling, grilling, and broiling instead.  Cook  with heart-healthy oils, such as olive, canola, soybean, or sunflower oil. Meal planning   Eat a balanced diet that includes: ? 5 or more servings of fruits and vegetables each day. At each meal, try to fill half of your plate with fruits and vegetables. ? Up to 6-8 servings of whole grains each day. ? Less than 6 oz of lean meat, poultry, or fish each day. A 3-oz serving of meat is about the same size as a deck of cards. One egg equals 1 oz. ? 2 servings of low-fat dairy each day. ? A serving of nuts, seeds, or beans 5 times each week. ? Heart-healthy fats. Healthy fats called Omega-3 fatty acids are found in  foods such as flaxseeds and coldwater fish, like sardines, salmon, and mackerel.  Limit how much you eat of the following: ? Canned or prepackaged foods. ? Food that is high in trans fat, such as fried foods. ? Food that is high in saturated fat, such as fatty meat. ? Sweets, desserts, sugary drinks, and other foods with added sugar. ? Full-fat dairy products.  Do not salt foods before eating.  Try to eat at least 2 vegetarian meals each week.  Eat more home-cooked food and less restaurant, buffet, and fast food.  When eating at a restaurant, ask that your food be prepared with less salt or no salt, if possible. What foods are recommended? The items listed may not be a complete list. Talk with your dietitian about what dietary choices are best for you. Grains Whole-grain or whole-wheat bread. Whole-grain or whole-wheat pasta. Brown rice. [place]. Bulgur. Whole-grain and low-sodium cereals. Pita bread. Low-fat, low-sodium crackers. Whole-wheat flour tortillas. Vegetables Fresh or frozen vegetables (raw, steamed, roasted, or grilled). Low-sodium or reduced-sodium tomato and vegetable juice. Low-sodium or reduced-sodium tomato sauce and tomato paste. Low-sodium or reduced-sodium canned vegetables. Fruits All fresh, dried, or frozen fruit. Canned fruit in natural juice  (without added sugar). Meat and other protein foods Skinless chicken or [place]. Ground chicken or [place]. Pork with fat trimmed off. Fish and seafood. Egg whites. Dried beans, peas, or lentils. Unsalted nuts, nut butters, and seeds. Unsalted canned beans. Lean cuts of beef with fat trimmed off. Low-sodium, lean deli meat. Dairy Low-fat (1%) or fat-free (skim) milk. Fat-free, low-fat, or reduced-fat cheeses. Nonfat, low-sodium ricotta or cottage cheese. Low-fat or nonfat yogurt. Low-fat, low-sodium cheese. Fats and oils Soft margarine without trans fats. Vegetable oil. Low-fat, reduced-fat, or light mayonnaise and salad dressings (reduced-sodium). Canola, safflower, olive, soybean, and sunflower oils. Avocado. Seasoning and other foods Herbs. Spices. Seasoning mixes without salt. Unsalted popcorn and pretzels. Fat-free sweets. What foods are not recommended? The items listed may not be a complete list. Talk with your dietitian about what dietary choices are best for you. Grains Baked goods made with fat, such as croissants, muffins, or some breads. Dry pasta or rice meal packs. Vegetables Creamed or fried vegetables. Vegetables in a cheese sauce. Regular canned vegetables (not low-sodium or reduced-sodium). Regular canned tomato sauce and paste (not low-sodium or reduced-sodium). Regular tomato and vegetable juice (not low-sodium or reduced-sodium). Angie Fava. Olives. Fruits Canned fruit in a light or heavy syrup. Fried fruit. Fruit in cream or butter sauce. Meat and other protein foods Fatty cuts of meat. Ribs. Fried meat. Berniece Salines. Sausage. Bologna and other processed lunch meats. Salami. Fatback. Hotdogs. Bratwurst. Salted nuts and seeds. Canned beans with added salt. Canned or smoked fish. Whole eggs or egg yolks. Chicken or [place] with skin. Dairy Whole or 2% milk, cream, and half-and-half. Whole or full-fat cream cheese. Whole-fat or sweetened yogurt. Full-fat cheese. Nondairy creamers. Whipped  toppings. Processed cheese and cheese spreads. Fats and oils Butter. Stick margarine. Lard. Shortening. Ghee. Bacon fat. Tropical oils, such as coconut, palm kernel, or palm oil. Seasoning and other foods Salted popcorn and pretzels. Onion salt, garlic salt, seasoned salt, table salt, and sea salt. Worcestershire sauce. Tartar sauce. Barbecue sauce. Teriyaki sauce. Soy sauce, including reduced-sodium. Steak sauce. Canned and packaged gravies. Fish sauce. Oyster sauce. Cocktail sauce. Horseradish that you find on the shelf. Ketchup. Mustard. Meat flavorings and tenderizers. Bouillon cubes. Hot sauce and Tabasco sauce. Premade or packaged marinades. Premade or packaged taco seasonings. Relishes. Regular  salad dressings. Where to find more information:  National Heart, Lung, and [place]: https://wilson-eaton.com/  American Heart Association: www.heart.org Summary  The DASH eating plan is a healthy eating plan that has been shown to reduce high blood pressure (hypertension). It may also reduce your risk for type 2 diabetes, heart disease, and stroke.  With the DASH eating plan, you should limit salt (sodium) intake to 2,300 mg a day. If you have hypertension, you may need to reduce your sodium intake to 1,500 mg a day.  When on the DASH eating plan, aim to eat more fresh fruits and vegetables, whole grains, lean proteins, low-fat dairy, and heart-healthy fats.  Work with your health care provider or diet and nutrition specialist (dietitian) to adjust your eating plan to your individual calorie needs. This information is not intended to replace advice given to you by your health care provider. Make sure you discuss any questions you have with your health care provider. Document Released: 03/11/2011 Document Revised: 03/15/2016 Document Reviewed: 03/15/2016 Elsevier Interactive Patient Education  [employer].

## 2017-10-13 NOTE — Progress Notes (Addendum)
Chief Complaint  Patient presents with  . Follow-up   F/u  1. HTN uncontrolled on norvasc 5 mg qd, hyzaar 100-25. She reports she did have 2 pieces of bacon this am repeat BP slightly reduced she took BP meds at 11 am or 12pm this am  2. Reviewed last labs platelets elevated will repeat and check iron levels  3. Overdue mammogram and colonoscopy will refer today  4. DM 2 A1C 6.4 05/18/17 on metfomin 1000 mg qd eye exam 05/11/17 Dr. Jacinto Reap Nice normal  5. C/o right leg intermittent swelling though today is a good day  Review of Systems  Constitutional: Negative for weight loss.  HENT: Negative for hearing loss.   Eyes: Negative for blurred vision.  Respiratory: Positive for cough. Negative for shortness of breath.   Cardiovascular: Positive for leg swelling.  Gastrointestinal: Negative for abdominal pain.  Musculoskeletal: Negative for falls.  Skin: Negative for rash.  Neurological: Negative for headaches.  Psychiatric/Behavioral: Negative for depression.   Past Medical History:  Diagnosis Date  . Asthma   . Chicken pox   . CTS (carpal tunnel syndrome)   . Diabetes ([place])   . Fibroids   . History of colon polyps   . History of shingles   . Hypercalcemia    resolved after extra calcium and hctz stopped   . Hypercalcemia    07/2013 stopped calcium and normalized   . Hypertension   . Shingles   . Sinus problem   . Thyroid disease    abnormal thyroid tests TSH elevated 07/2014 Dr. Dionisio David  . Vitamin D deficiency    Past Surgical History:  Procedure Laterality Date  . arm surgery     left arm age [age] y.o   . [place]    . TUBAL LIGATION     Family History  Problem Relation Age of Onset  . Diabetes Mother   . Heart disease Mother        MI  . Hypertension Mother   . Hyperlipidemia Mother   . Stroke Mother   . Alcohol abuse Father   . Cancer Father   . Hyperlipidemia Sister   . Hypertension Sister   . Alcohol abuse Brother   . Early death Brother   . Alcohol  abuse Brother   . Cancer Brother   . Depression Brother   . Hyperlipidemia Brother   . Breast cancer Neg Hx    Social History   Socioeconomic History  . Marital status: Married    Spouse name: Not on file  . Number of children: Not on file  . Years of education: Not on file  . Highest education level: Not on file  Occupational History  . Not on file  Social Needs  . Financial resource strain: Not hard at all  . Food insecurity:    Worry: Never true    Inability: Never true  . Transportation needs:    Medical: No    Non-medical: No  Tobacco Use  . Smoking status: Former Research scientist (life sciences)  . Smokeless tobacco: Never Used  Substance and Sexual Activity  . Alcohol use: No  . Drug use: No  . Sexual activity: Yes    Comment: husband  Lifestyle  . Physical activity:    Days per week: 0 days    Minutes per session: Not on file  . Stress: Not at all  Relationships  . Social connections:    Talks on phone: Not on file    Gets together:  Not on file    Attends religious service: Not on file    Active member of club or organization: Not on file    Attends meetings of clubs or organizations: Not on file    Relationship status: Not on file  . Intimate partner violence:    Fear of current or ex partner: No    Emotionally abused: No    Physically abused: No    Forced sexual activity: No  Other Topics Concern  . Not on file  Social History Narrative   1 year of college retired    Married    1 daughter    Current Meds  Medication Sig  . albuterol (PROVENTIL HFA;VENTOLIN HFA) 108 (90 Base) MCG/ACT inhaler Inhale 1-2 puffs into the lungs every 6 (six) hours as needed for wheezing or shortness of breath.  Marland Kitchen amLODipine (NORVASC) 10 MG tablet Take 1 tablet (10 mg total) by mouth daily.  . Ascorbic Acid (VITAMIN C PO) Take 1 tablet by mouth daily.   Marland Kitchen atorvastatin (LIPITOR) 80 MG tablet Take 1 tablet (80 mg total) by mouth daily.  . cholecalciferol (VITAMIN D) 1000 UNITS tablet Take 2,000  Units by mouth daily.  . Fluticasone-Salmeterol (ADVAIR DISKUS) 250-50 MCG/DOSE AEPB INHALE 1 PUFF VIA INHALER TWO TIMES PER DAY, IN THE MORNING AND EVENING, APPROXIMATELY 12 HOURS APART RINSE mouth after each use  . glucose blood (ACCU-CHEK SMARTVIEW) test strip USE 1 STRIP DAILY  . losartan-hydrochlorothiazide (HYZAAR) 100-25 MG tablet Take 1 tablet by mouth daily.  . metFORMIN (GLUCOPHAGE) 1000 MG tablet Take 1 tablet (1,000 mg total) by mouth daily with breakfast.  . montelukast (SINGULAIR) 10 MG tablet Take 1 tablet (10 mg total) by mouth daily. At night  . [DISCONTINUED] amLODipine (NORVASC) 5 MG tablet Take 1 tablet (5 mg total) by mouth daily.  . [DISCONTINUED] Fluticasone-Salmeterol (ADVAIR DISKUS) 250-50 MCG/DOSE AEPB INHALE 1 PUFF VIA INHALER TWO TIMES PER DAY, IN THE MORNING AND EVENING, APPROXIMATELY 12 HOURS APART RINSE mouth after each use  . [DISCONTINUED] metFORMIN (GLUCOPHAGE) 1000 MG tablet Take 1 tablet (1,000 mg total) by mouth daily with breakfast.   No Known Allergies No results found for this or any previous visit (from the past 2160 hour(s)). Objective  Body mass index is 34.46 kg/m. Wt Readings from Last 3 Encounters:  10/13/17 173 lb 12.8 oz (78.8 kg)  10/13/17 173 lb 8 oz (78.7 kg)  05/02/17 171 lb (77.6 kg)   Temp Readings from Last 3 Encounters:  10/13/17 98.3 F (36.8 C) (Oral)  10/13/17 98.3 F (36.8 C) (Oral)  05/02/17 98.2 F (36.8 C) (Oral)   BP Readings from Last 3 Encounters:  10/13/17 (!) 150/88  10/13/17 (!) 148/88  05/02/17 (!) 150/88   Pulse Readings from Last 3 Encounters:  10/13/17 84  10/13/17 84  05/02/17 88    Physical Exam  Constitutional: She is oriented to person, place, and time. She appears well-developed and well-nourished. She is cooperative.  HENT:  Head: Normocephalic and atraumatic.  Mouth/Throat: Oropharynx is clear and moist and mucous membranes are normal.  Eyes: Pupils are equal, round, and reactive to light.  Conjunctivae are normal.  Cardiovascular: Normal rate, regular rhythm and normal heart sounds.  Pulmonary/Chest: Effort normal and breath sounds normal.  Neurological: She is alert and oriented to person, place, and time. Gait normal.  Skin: Skin is warm, dry and intact.  Psychiatric: She has a normal mood and affect. Her speech is normal and behavior is normal. Judgment  and thought content normal. Cognition and memory are normal.  Nursing note and vitals reviewed.   Assessment   1. HTN uncontrolled  2. DM 2 A1C 6.4 05/18/17  3. Thrombocytosis, mild  4. Right leg edema intermittent  5. HM  6. H/o asthma, former tobacco abuse with persistent cough  Plan   1. Increase norvasc 5 to 10 mg qd, hyzaar 100/25 mg qd  Check bmet Reduce salt intake 2. Check A1C, urine protein [facility] form given for labs  Foot exam in future  Eye exam Dr. Jacinto Reap Nice 05/11/17 normal reviewed noted prev.  3. Check CBC, iron panel 4. Reduce salt intake not c/w DVT today leg edema not present  5.  Labs from [facility] expected mailed to pt BMET with GFR, CBC, A1C, TSH, urine protein, anemia panel due to plts being elevated   Will need flu upcoming had 05/04/16  prevnar had 12/18/15  Check on shingrix, pna 23, Tdap  rec hep B vaccine in the past negative hep B ab qualitative 12/18/15  Consider check MMR status in future   Repeat tsh mild subclinical hypothyroidism prev labs [facility] form given -consider thyroid US in future h/o 07/2014 elevated TSH  Subclinical hypothyroidism hcv neg 08/19/15   Pap neg 07/2014 [facility] and out of age window pap  Referred mammogram Referred [facility] colonoscopy h/o polyps she did do cologuard 01/08/16 negative but with h/o polyps colonoscopy preferred  DEXA normal 07/2014  Former smoker quit 40 years ago low risk and does not meet criteria see below  12/24/15 mild abdominal aortic ectasia 2.6 cm rec f/u US in 5 years also with 1.3 simple cyst right kidney and tiny simple cysts left lobe of liver   6.  Refilled inhalers  With persistent cough and smoking history(former) >1-2ppd in the past refer CT chest screening lung cancer denied due to quit >40 years ago (~ age 22) consider CT chest w/o contrast for persistent cough vs CXR   [facility]ed [facility] labs and notes chart updated accordingly    Provider: Dr. Olivia Mackie [facility]

## 2017-10-14 ENCOUNTER — Telehealth: Payer: Self-pay | Admitting: *Deleted

## 2017-10-14 ENCOUNTER — Telehealth: Payer: Self-pay | Admitting: Internal Medicine

## 2017-10-14 NOTE — Telephone Encounter (Signed)
ROI received from [place] on 10/14/17.

## 2017-10-14 NOTE — Telephone Encounter (Signed)
Received referral for low dose lung cancer screening CT scan. Contacted patient and reviewed smoking history which includes quit date 40 years ago. Patient understands that she does not meet eligibility criteria for lung cancer screening.

## 2017-10-17 ENCOUNTER — Encounter: Payer: Self-pay | Admitting: Internal Medicine

## 2017-10-17 MED ORDER — METFORMIN HCL 1000 MG PO TABS: 1000.0000 mg | ORAL_TABLET | Freq: Every day | ORAL | 3 refills | Status: DC

## 2017-10-17 MED ORDER — FLUTICASONE-SALMETEROL 250-50 MCG/DOSE IN AEPB: INHALATION_SPRAY | RESPIRATORY_TRACT | 11 refills | Status: DC

## 2017-10-19 NOTE — Progress Notes (Signed)
Immunizations have been added to chart.

## 2017-10-24 ENCOUNTER — Encounter: Payer: Self-pay | Admitting: Internal Medicine

## 2017-10-24 ENCOUNTER — Telehealth: Payer: Self-pay | Admitting: Internal Medicine

## 2017-10-24 NOTE — Telephone Encounter (Signed)
Call patient advise we could do CXR in our office since CT chest denied and if cough is persistent I still rec CT chest w/on contrast instead of low dose CT chest to work up further   Does she want to do to CXR here in our office for recurring cough?  It maybe nothing but I wanted to work up with imaging at least   [place]

## 2017-10-25 NOTE — Telephone Encounter (Signed)
Please advise 

## 2017-10-26 NOTE — Progress Notes (Signed)
Vaccinations has already been transferred

## 2017-10-26 NOTE — Telephone Encounter (Signed)
Spoken to patient. She does not want to do XRAY either, She stated sx have died down and is barely coughing.  She would like to hold off on everything.

## 2017-11-08 LAB — HM DIABETES EYE EXAM

## 2017-11-21 LAB — HM DIABETES EYE EXAM

## 2017-11-30 ENCOUNTER — Encounter: Payer: Self-pay | Admitting: Internal Medicine

## 2017-12-06 ENCOUNTER — Encounter: Payer: Self-pay | Admitting: Internal Medicine

## 2017-12-21 ENCOUNTER — Other Ambulatory Visit: Payer: Self-pay | Admitting: Internal Medicine

## 2017-12-21 ENCOUNTER — Telehealth: Payer: Self-pay | Admitting: Internal Medicine

## 2017-12-21 NOTE — Telephone Encounter (Signed)
Pt came in and was wanting a call back from [place] about labs that were suppose to be placed in July. Please contact pt

## 2017-12-22 LAB — CBC WITH DIFFERENTIAL/PLATELET
BASOS: 1 %
Basophils Absolute: 0.1 10*3/uL (ref 0.0–0.2)
EOS (ABSOLUTE): 0.4 10*3/uL (ref 0.0–0.4)
EOS: 8 %
HEMATOCRIT: 39.7 % (ref 34.0–46.6)
Hemoglobin: 12.7 g/dL (ref 11.1–15.9)
IMMATURE GRANS (ABS): 0 10*3/uL (ref 0.0–0.1)
IMMATURE GRANULOCYTES: 0 %
LYMPHS: 32 %
Lymphocytes Absolute: 1.4 10*3/uL (ref 0.7–3.1)
MCH: 28.9 pg (ref 26.6–33.0)
MCHC: 32 g/dL (ref 31.5–35.7)
MCV: 90 fL (ref 79–97)
Monocytes Absolute: 0.4 10*3/uL (ref 0.1–0.9)
Monocytes: 9 %
NEUTROS PCT: 50 %
Neutrophils Absolute: 2.3 10*3/uL (ref 1.4–7.0)
PLATELETS: 309 10*3/uL (ref 150–450)
RBC: 4.4 x10E6/uL (ref 3.77–5.28)
RDW: 13.3 % (ref 12.3–15.4)
WBC: 4.6 10*3/uL (ref 3.4–10.8)

## 2017-12-22 LAB — BASIC METABOLIC PANEL
BUN/Creatinine Ratio: 18 (ref 12–28)
BUN: 15 mg/dL (ref 8–27)
CALCIUM: 10.2 mg/dL (ref 8.7–10.3)
CHLORIDE: 101 mmol/L (ref 96–106)
CO2: 28 mmol/L (ref 20–29)
Creatinine, Ser: 0.84 mg/dL (ref 0.57–1.00)
GFR calc non Af Amer: 70 mL/min/{1.73_m2} (ref >59)
GFR, EST AFRICAN AMERICAN: 80 mL/min/{1.73_m2} (ref >59)
Glucose: 89 mg/dL (ref 65–99)
POTASSIUM: 4.1 mmol/L (ref 3.5–5.2)
Sodium: 144 mmol/L (ref 134–144)

## 2017-12-22 LAB — IRON AND TIBC
IRON SATURATION: 28 % (ref 15–55)
IRON: 86 ug/dL (ref 27–139)
Total Iron Binding Capacity: 310 ug/dL (ref 250–450)
UIBC: 224 ug/dL (ref 118–369)

## 2017-12-22 LAB — MICROALBUMIN / CREATININE URINE RATIO
Creatinine, Urine: 207.5 mg/dL
Microalb/Creat Ratio: 10.3 mg/g creat (ref 0.0–30.0)
Microalbumin, Urine: 21.4 ug/mL

## 2017-12-22 LAB — FERRITIN: Ferritin: 60 ng/mL (ref 15–150)

## 2017-12-22 LAB — HGB A1C W/O EAG: Hgb A1c MFr Bld: 6.8 % — ABNORMAL HIGH (ref 4.8–5.6)

## 2017-12-22 LAB — TSH: TSH: 1.64 u[IU]/mL (ref 0.450–4.500)

## 2017-12-23 NOTE — Telephone Encounter (Signed)
Spoken to patient she has gotten lab work done. She was wondering why we mailed her the orders. And why not to the lab.

## 2018-01-11 ENCOUNTER — Telehealth: Payer: Self-pay | Admitting: Internal Medicine

## 2018-01-11 NOTE — Telephone Encounter (Signed)
Copied from [place] [id number]. Topic: General - Other >> Jan 11, 2018  9:35 AM Keene Breath wrote: Reason for CRM: Patient called to request a referral for a colonoscopy with Dr. Richardson Landry.  Patient stated she cannot go to [place].  Please advise and call patient back at [phone]

## 2018-01-13 ENCOUNTER — Ambulatory Visit: Payer: Medicare Other | Admitting: Internal Medicine

## 2018-02-14 ENCOUNTER — Ambulatory Visit: Payer: Medicare Other | Admitting: General Surgery

## 2018-02-15 ENCOUNTER — Ambulatory Visit
Admission: RE | Admit: 2018-02-15 | Discharge: 2018-02-15 | Disposition: A | Discharge status: Home / Self Care | Payer: Medicare Other | Source: Ambulatory Visit | Attending: Internal Medicine | Admitting: Internal Medicine

## 2018-02-15 DIAGNOSIS — Z1231 Encounter for screening mammogram for malignant neoplasm of breast: Secondary | ICD-10-CM | POA: Diagnosis present

## 2018-05-05 ENCOUNTER — Other Ambulatory Visit: Payer: Self-pay | Admitting: Internal Medicine

## 2018-05-05 ENCOUNTER — Telehealth: Payer: Self-pay | Admitting: *Deleted

## 2018-05-05 DIAGNOSIS — I10 Essential (primary) hypertension: Secondary | ICD-10-CM

## 2018-05-05 DIAGNOSIS — E119 Type 2 diabetes mellitus without complications: Secondary | ICD-10-CM

## 2018-05-05 DIAGNOSIS — Z1389 Encounter for screening for other disorder: Secondary | ICD-10-CM

## 2018-05-05 NOTE — Telephone Encounter (Signed)
Lab orders in please come fasting 8-12 hrs nothing but water and medications  Schedule lab visit before office visit   Tracy Torres

## 2018-05-05 NOTE — Telephone Encounter (Signed)
Copied from [place] [id number]. Topic: General - Other >> May 05, 2018  1:08 PM Carolyn Stare wrote:  Pt called to ask if she need to have her labs drawn prior to her visit in February.  She said Dr McLean-Scocuzza knows what to give her

## 2018-05-09 ENCOUNTER — Other Ambulatory Visit: Payer: Self-pay | Admitting: Internal Medicine

## 2018-05-09 DIAGNOSIS — J4520 Mild intermittent asthma, uncomplicated: Secondary | ICD-10-CM

## 2018-05-09 LAB — HM DIABETES EYE EXAM

## 2018-05-09 MED ORDER — MONTELUKAST SODIUM 10 MG PO TABS: 10.0000 mg | ORAL_TABLET | Freq: Every day | ORAL | 3 refills | Status: DC

## 2018-05-09 NOTE — Telephone Encounter (Signed)
Left message for patient to return call back. [facility] may give and obtain information.  

## 2018-05-10 ENCOUNTER — Encounter: Payer: Self-pay | Admitting: Internal Medicine

## 2018-05-10 NOTE — Telephone Encounter (Signed)
Call pt to pick up [facility] as soon as possible and be fasting 8-12 hrs prior to labs appt 05/23/2018 and wanted labs before   tMS

## 2018-05-10 NOTE — Telephone Encounter (Signed)
Copied from [place] [id number]. Topic: Quick Communication - See Telephone Encounter >> May 09, 2018  4:34 PM Babs Bertin, CMA wrote: CRM for notification. See Telephone encounter for: 05/09/18. >> May 09, 2018  5:22 PM Yvette Rack wrote: Pt stated she would like a written order to have her labs drawn at [facility]. Pt requests call back once written order is ready.

## 2018-05-11 NOTE — Telephone Encounter (Signed)
Patient has been informed. Paperwork placed up front for pick up.

## 2018-05-23 ENCOUNTER — Ambulatory Visit: Payer: Medicare Other | Admitting: Internal Medicine

## 2018-05-24 ENCOUNTER — Other Ambulatory Visit: Payer: Self-pay | Admitting: Internal Medicine

## 2018-05-25 ENCOUNTER — Telehealth: Payer: Self-pay | Admitting: Internal Medicine

## 2018-05-25 DIAGNOSIS — R946 Abnormal results of thyroid function studies: Secondary | ICD-10-CM

## 2018-05-25 DIAGNOSIS — I10 Essential (primary) hypertension: Secondary | ICD-10-CM

## 2018-05-25 LAB — CBC WITH DIFFERENTIAL/PLATELET
Basophils Absolute: 0.1 10*3/uL (ref 0.0–0.2)
Basos: 2 %
EOS (ABSOLUTE): 0.4 10*3/uL (ref 0.0–0.4)
Eos: 7 %
HEMATOCRIT: 40.3 % (ref 34.0–46.6)
Hemoglobin: 13 g/dL (ref 11.1–15.9)
Immature Grans (Abs): 0 10*3/uL (ref 0.0–0.1)
Immature Granulocytes: 1 %
Lymphocytes Absolute: 1.7 10*3/uL (ref 0.7–3.1)
Lymphs: 30 %
MCH: 28.6 pg (ref 26.6–33.0)
MCHC: 32.3 g/dL (ref 31.5–35.7)
MCV: 89 fL (ref 79–97)
Monocytes Absolute: 0.5 10*3/uL (ref 0.1–0.9)
Monocytes: 9 %
Neutrophils Absolute: 2.9 10*3/uL (ref 1.4–7.0)
Neutrophils: 51 %
Platelets: 329 10*3/uL (ref 150–450)
RBC: 4.55 x10E6/uL (ref 3.77–5.28)
RDW: 13.4 % (ref 11.7–15.4)
WBC: 5.7 10*3/uL (ref 3.4–10.8)

## 2018-05-25 LAB — LIPID PANEL W/O CHOL/HDL RATIO
Cholesterol, Total: 155 mg/dL (ref 100–199)
HDL: 47 mg/dL (ref >39)
LDL Calculated: 98 mg/dL (ref 0–99)
Triglycerides: 48 mg/dL (ref 0–149)
VLDL Cholesterol Cal: 10 mg/dL (ref 5–40)

## 2018-05-25 LAB — URINALYSIS, ROUTINE W REFLEX MICROSCOPIC
BILIRUBIN UA: NEGATIVE
Glucose, UA: NEGATIVE
Ketones, UA: NEGATIVE
Leukocytes, UA: NEGATIVE
Nitrite, UA: NEGATIVE
PH UA: 5.5 (ref 5.0–7.5)
RBC, UA: NEGATIVE
Specific Gravity, UA: 1.017 (ref 1.005–1.030)
Urobilinogen, Ur: 0.2 mg/dL (ref 0.2–1.0)

## 2018-05-25 LAB — HGB A1C W/O EAG: Hgb A1c MFr Bld: 6.7 % — ABNORMAL HIGH (ref 4.8–5.6)

## 2018-05-25 LAB — TSH: TSH: 4.57 u[IU]/mL — ABNORMAL HIGH (ref 0.450–4.500)

## 2018-05-25 NOTE — Telephone Encounter (Signed)
Labs 05/24/2018  Blood cts normal  Urine normal  TC 155, TG 48, HDL 47, LDL 98  A1c 6.7  TSH 4.570 elevated    Will disc with pt at f/u  Try to add on CMET, T4/T3, thyroid peroxidase antibodies   TMS

## 2018-05-26 LAB — SPECIMEN STATUS REPORT

## 2018-05-29 LAB — COMPREHENSIVE METABOLIC PANEL
ALT: 16 IU/L (ref 0–32)
AST: 15 IU/L (ref 0–40)
Albumin/Globulin Ratio: 1.6 (ref 1.2–2.2)
Albumin: 4.2 g/dL (ref 3.7–4.7)
Alkaline Phosphatase: 83 IU/L (ref 39–117)
BUN/Creatinine Ratio: 10 — ABNORMAL LOW (ref 12–28)
BUN: 9 mg/dL (ref 8–27)
Bilirubin Total: 0.3 mg/dL (ref 0.0–1.2)
CO2: 24 mmol/L (ref 20–29)
CREATININE: 0.92 mg/dL (ref 0.57–1.00)
Calcium: 10.3 mg/dL (ref 8.7–10.3)
Chloride: 101 mmol/L (ref 96–106)
GFR calc Af Amer: 72 mL/min/{1.73_m2} (ref >59)
GFR calc non Af Amer: 62 mL/min/{1.73_m2} (ref >59)
Globulin, Total: 2.6 g/dL (ref 1.5–4.5)
Glucose: 91 mg/dL (ref 65–99)
Potassium: 4.6 mmol/L (ref 3.5–5.2)
Sodium: 144 mmol/L (ref 134–144)
Total Protein: 6.8 g/dL (ref 6.0–8.5)

## 2018-05-29 LAB — THYROID PEROXIDASE ANTIBODY: Thyroperoxidase Ab SerPl-aCnc: 8 IU/mL (ref 0–34)

## 2018-05-29 LAB — T3, FREE: T3 FREE: 2.8 pg/mL (ref 2.0–4.4)

## 2018-05-29 LAB — T4, FREE: Free T4: 0.91 ng/dL (ref 0.82–1.77)

## 2018-05-29 LAB — SPECIMEN STATUS REPORT

## 2018-05-31 ENCOUNTER — Ambulatory Visit: Payer: Medicare Other | Admitting: Internal Medicine

## 2018-05-31 VITALS — BP 138/72 | HR 96 | Temp 98.1°F | Resp 18 | Ht 59.5 in | Wt 173.0 lb

## 2018-05-31 DIAGNOSIS — E119 Type 2 diabetes mellitus without complications: Secondary | ICD-10-CM | POA: Diagnosis not present

## 2018-05-31 DIAGNOSIS — I10 Essential (primary) hypertension: Secondary | ICD-10-CM

## 2018-05-31 DIAGNOSIS — R1084 Generalized abdominal pain: Secondary | ICD-10-CM

## 2018-05-31 DIAGNOSIS — R140 Abdominal distension (gaseous): Secondary | ICD-10-CM

## 2018-05-31 DIAGNOSIS — J4520 Mild intermittent asthma, uncomplicated: Secondary | ICD-10-CM

## 2018-05-31 DIAGNOSIS — M545 Low back pain, unspecified: Secondary | ICD-10-CM

## 2018-05-31 DIAGNOSIS — R7989 Other specified abnormal findings of blood chemistry: Secondary | ICD-10-CM

## 2018-05-31 DIAGNOSIS — M549 Dorsalgia, unspecified: Secondary | ICD-10-CM

## 2018-05-31 DIAGNOSIS — R109 Unspecified abdominal pain: Secondary | ICD-10-CM | POA: Insufficient documentation

## 2018-05-31 MED ORDER — ACCU-CHEK SOFT TOUCH LANCETS MISC: 12 refills | Status: DC

## 2018-05-31 MED ORDER — FLUTICASONE-SALMETEROL 250-50 MCG/DOSE IN AEPB: INHALATION_SPRAY | RESPIRATORY_TRACT | 11 refills | Status: DC

## 2018-05-31 MED ORDER — METFORMIN HCL 1000 MG PO TABS: 1000.0000 mg | ORAL_TABLET | Freq: Every day | ORAL | 3 refills | Status: DC

## 2018-05-31 MED ORDER — ATORVASTATIN CALCIUM 80 MG PO TABS: 80.0000 mg | ORAL_TABLET | Freq: Every day | ORAL | 3 refills | Status: DC

## 2018-05-31 MED ORDER — AMLODIPINE BESYLATE 10 MG PO TABS: 10.0000 mg | ORAL_TABLET | Freq: Every day | ORAL | 3 refills | Status: DC

## 2018-05-31 MED ORDER — GLUCOSE BLOOD VI STRP: ORAL_STRIP | 11 refills | Status: DC

## 2018-05-31 MED ORDER — MONTELUKAST SODIUM 10 MG PO TABS: 10.0000 mg | ORAL_TABLET | Freq: Every day | ORAL | 3 refills | Status: DC

## 2018-05-31 MED ORDER — LOSARTAN POTASSIUM-HCTZ 100-25 MG PO TABS: 1.0000 | ORAL_TABLET | Freq: Every day | ORAL | 3 refills | Status: DC

## 2018-05-31 MED ORDER — ALBUTEROL SULFATE HFA 108 (90 BASE) MCG/ACT IN AERS: 1.0000 | INHALATION_SPRAY | Freq: Four times a day (QID) | RESPIRATORY_TRACT | 11 refills | Status: DC | PRN

## 2018-05-31 NOTE — Patient Instructions (Addendum)
CT abdomen and pelvis we will schedule 06/19/2018   Low back Xray and middle back  D3 2000 IU daily vitamin D   Hypertension Hypertension, commonly called high blood pressure, is when the force of blood pumping through the arteries is too strong. The arteries are the blood vessels that carry blood from the heart throughout the body. Hypertension forces the heart to work harder to pump blood and may cause arteries to become narrow or stiff. Having untreated or uncontrolled hypertension can cause heart attacks, strokes, kidney disease, and other problems. A blood pressure reading consists of a higher number over a lower number. Ideally, your blood pressure should be below 120/80. The first ("top") number is called the systolic pressure. It is a measure of the pressure in your arteries as your heart beats. The second ("bottom") number is called the diastolic pressure. It is a measure of the pressure in your arteries as the heart relaxes. What are the causes? The cause of this condition is not known. What increases the risk? Some risk factors for high blood pressure are under your control. Others are not. Factors you can change  Smoking.  Having type 2 diabetes mellitus, high cholesterol, or both.  Not getting enough exercise or physical activity.  Being overweight.  Having too much fat, sugar, calories, or salt (sodium) in your diet.  Drinking too much alcohol. Factors that are difficult or impossible to change  Having chronic kidney disease.  Having a family history of high blood pressure.  Age. Risk increases with age.  Race. You may be at higher risk if you are African-American.  Gender. Men are at higher risk than women before age 40. After age 7, women are at higher risk than men.  Having obstructive sleep apnea.  Stress. What are the signs or symptoms? Extremely high blood pressure (hypertensive crisis) may cause:  Headache.  Anxiety.  Shortness of  breath.  Nosebleed.  Nausea and vomiting.  Severe chest pain.  Jerky movements you cannot control (seizures). How is this diagnosed? This condition is diagnosed by measuring your blood pressure while you are seated, with your arm resting on a surface. The cuff of the blood pressure monitor will be placed directly against the skin of your upper arm at the level of your heart. It should be measured at least twice using the same arm. Certain conditions can cause a difference in blood pressure between your right and left arms. Certain factors can cause blood pressure readings to be lower or higher than normal (elevated) for a short period of time:  When your blood pressure is higher when you are in a health care provider's office than when you are at home, this is called white coat hypertension. Most people with this condition do not need medicines.  When your blood pressure is higher at home than when you are in a health care provider's office, this is called masked hypertension. Most people with this condition may need medicines to control blood pressure. If you have a high blood pressure reading during one visit or you have normal blood pressure with other risk factors:  You may be asked to return on a different day to have your blood pressure checked again.  You may be asked to monitor your blood pressure at home for 1 week or longer. If you are diagnosed with hypertension, you may have other blood or imaging tests to help your health care provider understand your overall risk for other conditions. How is this treated?  This condition is treated by making healthy lifestyle changes, such as eating healthy foods, exercising more, and reducing your alcohol intake. Your health care provider may prescribe medicine if lifestyle changes are not enough to get your blood pressure under control, and if:  Your systolic blood pressure is above 130.  Your diastolic blood pressure is above 80. Your  personal target blood pressure may vary depending on your medical conditions, your age, and other factors. Follow these instructions at home: Eating and drinking   Eat a diet that is high in fiber and potassium, and low in sodium, added sugar, and fat. An example eating plan is called the DASH (Dietary Approaches to Stop Hypertension) diet. To eat this way: ? Eat plenty of fresh fruits and vegetables. Try to fill half of your plate at each meal with fruits and vegetables. ? Eat whole grains, such as whole wheat pasta, brown rice, or whole grain bread. Fill about one quarter of your plate with whole grains. ? Eat or drink low-fat dairy products, such as skim milk or low-fat yogurt. ? Avoid fatty cuts of meat, processed or cured meats, and poultry with skin. Fill about one quarter of your plate with lean proteins, such as fish, chicken without skin, beans, eggs, and tofu. ? Avoid premade and processed foods. These tend to be higher in sodium, added sugar, and fat.  Reduce your daily sodium intake. Most people with hypertension should eat less than 1,500 mg of sodium a day.  Limit alcohol intake to no more than 1 drink a day for nonpregnant women and 2 drinks a day for men. One drink equals 12 oz of beer, 5 oz of wine, or 1 oz of hard liquor. Lifestyle   Work with your health care provider to maintain a healthy body weight or to lose weight. Ask what an ideal weight is for you.  Get at least 30 minutes of exercise that causes your heart to beat faster (aerobic exercise) most days of the week. Activities may include walking, swimming, or biking.  Include exercise to strengthen your muscles (resistance exercise), such as pilates or lifting weights, as part of your weekly exercise routine. Try to do these types of exercises for 30 minutes at least 3 days a week.  Do not use any products that contain nicotine or tobacco, such as cigarettes and e-cigarettes. If you need help quitting, ask your  health care provider.  Monitor your blood pressure at home as told by your health care provider.  Keep all follow-up visits as told by your health care provider. This is important. Medicines  Take over-the-counter and prescription medicines only as told by your health care provider. Follow directions carefully. Blood pressure medicines must be taken as prescribed.  Do not skip doses of blood pressure medicine. Doing this puts you at risk for problems and can make the medicine less effective.  Ask your health care provider about side effects or reactions to medicines that you should watch for. Contact a health care provider if:  You think you are having a reaction to a medicine you are taking.  You have headaches that keep coming back (recurring).  You feel dizzy.  You have swelling in your ankles.  You have trouble with your vision. Get help right away if:  You develop a severe headache or confusion.  You have unusual weakness or numbness.  You feel faint.  You have severe pain in your chest or abdomen.  You vomit repeatedly.  You have  trouble breathing. Summary  Hypertension is when the force of blood pumping through your arteries is too strong. If this condition is not controlled, it may put you at risk for serious complications.  Your personal target blood pressure may vary depending on your medical conditions, your age, and other factors. For most people, a normal blood pressure is less than 120/80.  Hypertension is treated with lifestyle changes, medicines, or a combination of both. Lifestyle changes include weight loss, eating a healthy, low-sodium diet, exercising more, and limiting alcohol. This information is not intended to replace advice given to you by your health care provider. Make sure you discuss any questions you have with your health care provider. Document Released: 03/22/2005 Document Revised: 02/18/2016 Document Reviewed: 02/18/2016 Elsevier  Inter[employer] Patient Education  2019 [employer].

## 2018-05-31 NOTE — Progress Notes (Signed)
Chief Complaint  Patient presents with  . Follow-up   6-7 month F/u  1. HTN BP 138/72 today on norvasc 10 mg qd and Hyzaar 100-/25 mg qd also on lipitor 80 mg qhs needs refills on all meds and DM supplies she does not check BP at home  2. DM 2 A1C 6.7 05/24/18 on metformin 1000 mg qd ARB and statin  3. C/o pain from waist to her legs and knees and buttocks at times esp with certain positions I.e bending, lying hard to get comfortable, denies leg weakness of low back pain specifically but c/o pain from waist down to her knees today. She tried a new mattress to see if would help but did not no medications tried. Pain started in 03/2018 after getting both shingrix and flu vaccines at the same time.  4 asthma controlled on Advair 250/50, prn Albuterol and singulair needs refills   Review of Systems  Constitutional: Negative for weight loss.  HENT: Negative for hearing loss.   Eyes: Negative for blurred vision.  Respiratory: Negative for shortness of breath.   Cardiovascular: Negative for chest pain.  Gastrointestinal: Positive for abdominal pain.  Musculoskeletal: Positive for back pain and joint pain.  Skin: Negative for rash.  Neurological: Negative for headaches.  Psychiatric/Behavioral: Negative for depression and memory loss.   Past Medical History:  Diagnosis Date  . Asthma   . Chicken pox   . CTS (carpal tunnel syndrome)   . Diabetes ([place])   . Fibroids   . History of colon polyps   . History of shingles   . Hypercalcemia    resolved after extra calcium and hctz stopped   . Hypercalcemia    07/2013 stopped calcium and normalized   . Hypertension   . Shingles   . Sinus problem   . Thyroid disease    abnormal thyroid tests TSH elevated 07/2014 Dr. Dionisio David  . Vitamin D deficiency    Past Surgical History:  Procedure Laterality Date  . arm surgery     left arm age [age] y.o   . [place]    . TUBAL LIGATION     Family History  Problem Relation Age of Onset  .  Diabetes Mother   . Heart disease Mother        MI  . Hypertension Mother   . Hyperlipidemia Mother   . Stroke Mother   . Alcohol abuse Father   . Cancer Father   . Hyperlipidemia Sister   . Hypertension Sister   . Alcohol abuse Brother   . Early death Brother   . Alcohol abuse Brother   . Cancer Brother   . Depression Brother   . Hyperlipidemia Brother   . Breast cancer Neg Hx    Social History   Socioeconomic History  . Marital status: Married    Spouse name: Not on file  . Number of children: Not on file  . Years of education: Not on file  . Highest education level: Not on file  Occupational History  . Not on file  Social Needs  . Financial resource strain: Not hard at all  . Food insecurity:    Worry: Never true    Inability: Never true  . Transportation needs:    Medical: No    Non-medical: No  Tobacco Use  . Smoking status: Former Research scientist (life sciences)  . Smokeless tobacco: Never Used  Substance and Sexual Activity  . Alcohol use: No  . Drug use: No  . Sexual  activity: Yes    Comment: husband  Lifestyle  . Physical activity:    Days per week: 0 days    Minutes per session: Not on file  . Stress: Not at all  Relationships  . Social connections:    Talks on phone: Not on file    Gets together: Not on file    Attends religious service: Not on file    Active member of club or organization: Not on file    Attends meetings of clubs or organizations: Not on file    Relationship status: Not on file  . Intimate partner violence:    Fear of current or ex partner: No    Emotionally abused: No    Physically abused: No    Forced sexual activity: No  Other Topics Concern  . 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 albuterol (PROVENTIL HFA;VENTOLIN HFA) 108 (90 Base) MCG/ACT inhaler Inhale 1-2 puffs into the lungs every 6 (six)  hours as needed for wheezing or shortness of breath.  Marland Kitchen amLODipine (NORVASC) 10 MG tablet Take 1 tablet (10 mg total) by mouth daily.  . Ascorbic Acid (VITAMIN C PO) Take 1 tablet by mouth daily.   Marland Kitchen atorvastatin (LIPITOR) 80 MG tablet Take 1 tablet (80 mg total) by mouth daily.  . cholecalciferol (VITAMIN D) 1000 UNITS tablet Take 2,000 Units by mouth daily.  . Fluticasone-Salmeterol (ADVAIR DISKUS) 250-50 MCG/DOSE AEPB INHALE 1 PUFF VIA INHALER TWO TIMES PER DAY, IN THE MORNING AND EVENING, APPROXIMATELY 12 HOURS APART RINSE mouth after each use  . glucose blood (ACCU-CHEK SMARTVIEW) test strip USE 1 STRIP DAILY  . losartan-hydrochlorothiazide (HYZAAR) 100-25 MG tablet Take 1 tablet by mouth daily.  . metFORMIN (GLUCOPHAGE) 1000 MG tablet Take 1 tablet (1,000 mg total) by mouth daily with breakfast.  . montelukast (SINGULAIR) 10 MG tablet Take 1 tablet (10 mg total) by mouth daily. At night   No Known Allergies Recent Results (from the past 2160 hour(s))  HM DIABETES EYE EXAM     Status: None   Collection Time: 05/09/18 12:00 AM  Result Value Ref Range   HM Diabetic Eye Exam No Retinopathy No Retinopathy    Comment: cataract nuclear ou worsening Dr. Jomarie Longs   CBC with Differential/Platelet     Status: None   Collection Time: 05/24/18  8:38 AM  Result Value Ref Range   WBC 5.7 3.4 - 10.8 x10E3/uL   RBC 4.55 3.77 - 5.28 x10E6/uL   Hemoglobin 13.0 11.1 - 15.9 g/dL   Hematocrit 40.3 34.0 - 46.6 %   MCV 89 79 - 97 fL   MCH 28.6 26.6 - 33.0 pg   MCHC 32.3 31.5 - 35.7 g/dL   RDW 13.4 11.7 - 15.4 %   Platelets 329 150 - 450 x10E3/uL   Neutrophils 51 Not Estab. %   Lymphs 30 Not Estab. %   Monocytes 9 Not Estab. %   Eos 7 Not Estab. %   Basos 2 Not Estab. %   Neutrophils Absolute 2.9 1.4 - 7.0 x10E3/uL   Lymphocytes Absolute 1.7 0.7 - 3.1 x10E3/uL   Monocytes Absolute 0.5 0.1 - 0.9 x10E3/uL   EOS (ABSOLUTE) 0.4 0.0 - 0.4 x10E3/uL   Basophils Absolute 0.1 0.0 - 0.2 x10E3/uL  Immature Granulocytes 1 Not Estab. %   Immature Grans (Abs) 0.0 0.0 - 0.1 x10E3/uL  Urinalysis, Routine w reflex microscopic     Status: Abnormal   Collection Time: 05/24/18  8:38 AM  Result Value Ref Range   Specific Gravity, UA 1.017 1.005 - 1.030   pH, UA 5.5 5.0 - 7.5   Color, UA Yellow Yellow   Appearance Ur Cloudy (A) Clear   Leukocytes, UA Negative Negative   Protein, UA Trace Negative/Trace   Glucose, UA Negative Negative   Ketones, UA Negative Negative   RBC, UA Negative Negative   Bilirubin, UA Negative Negative   Urobilinogen, Ur 0.2 0.2 - 1.0 mg/dL   Nitrite, UA Negative Negative   Microscopic Examination Comment     Comment: Microscopic not indicated and not performed.  Lipid Panel w/o Chol/HDL Ratio     Status: None   Collection Time: 05/24/18  8:38 AM  Result Value Ref Range   Cholesterol, Total 155 100 - 199 mg/dL   Triglycerides 48 0 - 149 mg/dL   HDL 47 >39 mg/dL   VLDL Cholesterol Cal 10 5 - 40 mg/dL   LDL Calculated 98 0 - 99 mg/dL  Hgb A1c w/o eAG     Status: Abnormal   Collection Time: 05/24/18  8:38 AM  Result Value Ref Range   Hgb A1c MFr Bld 6.7 (H) 4.8 - 5.6 %    Comment:          Prediabetes: 5.7 - 6.4          Diabetes: >6.4          Glycemic control for adults with diabetes: <7.0   TSH     Status: Abnormal   Collection Time: 05/24/18  8:38 AM  Result Value Ref Range   TSH 4.570 (H) 0.450 - 4.500 uIU/mL  Specimen status report     Status: None   Collection Time: 05/24/18  8:38 AM  Result Value Ref Range   specimen status report Comment     Comment: Verbal Order See below: The [place] Code of [employer] requires a written and signed request be forwarded to a laboratory following a verbal order of a laboratory test.  Please assist [place] to meet this requirement and to complete our records. Date:________________________ []  This is an interface add on-change account number to:_____________    New EDI  Acc#(CTRL):_____________________________ []  Leave account number as:__________________________________________ ICD-9/10 Diagnosis Code(s):__________________________________________ Physician or Authorized Designee:____________________________________                                            Please Print Physician or Authorized Designee Signature _____________________________________________________________________ Your [employer] Your Order Of The Test(s) Listed. Please provide requested information and fax to [phone]. Additional Test(s) Requested Comment: Test(s) added per Elpidio Galea at a ccount 05-26-2018 Logged by Kathryne Gin Test# [id number] Comp. Metabolic Panel (14) Test# [id number] Thyroxine (T4) Free, Direct, S Test# [id number] Triiodothyronine (T3), Free Test# [id number] Thyroid Peroxidase (TPO) Ab Diagnosis Codes Provided    E11.9      E78.5      Z00.00     R79.89     D47.3   Comprehensive metabolic panel     Status: Abnormal   Collection Time: 05/24/18  8:38 AM  Result Value Ref Range   Glucose 91 65 - 99 mg/dL   BUN 9 8 - 27  mg/dL   Creatinine, Ser 0.92 0.57 - 1.00 mg/dL   GFR calc non Af Amer 62 >59 mL/min/1.73   GFR calc Af Amer 72 >59 mL/min/1.73   BUN/Creatinine Ratio 10 (L) 12 - 28   Sodium 144 134 - 144 mmol/L   Potassium 4.6 3.5 - 5.2 mmol/L   Chloride 101 96 - 106 mmol/L   CO2 24 20 - 29 mmol/L   Calcium 10.3 8.7 - 10.3 mg/dL   Total Protein 6.8 6.0 - 8.5 g/dL   Albumin 4.2 3.7 - 4.7 g/dL    Comment:               **Please note reference interval change**   Globulin, Total 2.6 1.5 - 4.5 g/dL   Albumin/Globulin Ratio 1.6 1.2 - 2.2   Bilirubin Total 0.3 0.0 - 1.2 mg/dL   Alkaline Phosphatase 83 39 - 117 IU/L   AST 15 0 - 40 IU/L   ALT 16 0 - 32 IU/L  T4, free     Status: None   Collection Time: 05/24/18  8:38 AM  Result Value Ref Range   Free T4 0.91 0.82 - 1.77 ng/dL  T3, free     Status: None   Collection Time: 05/24/18  8:38 AM  Result  Value Ref Range   T3, Free 2.8 2.0 - 4.4 pg/mL  Thyroid peroxidase antibody     Status: None   Collection Time: 05/24/18  8:38 AM  Result Value Ref Range   Thyroperoxidase Ab SerPl-aCnc 8 0 - 34 IU/mL  Specimen status report     Status: None   Collection Time: 05/24/18  8:38 AM  Result Value Ref Range   specimen status report Comment     Comment: Written Authorization Written Authorization Written Authorization Received. Authorization received from [facility] 05-29-2018 Logged by Kathryne Gin    Objective  Body mass index is 34.36 kg/m. Wt Readings from Last 3 Encounters:  05/31/18 173 lb (78.5 kg)  10/13/17 173 lb 12.8 oz (78.8 kg)  10/13/17 173 lb 8 oz (78.7 kg)   Temp Readings from Last 3 Encounters:  05/31/18 98.1 F (36.7 C) (Oral)  10/13/17 98.3 F (36.8 C) (Oral)  10/13/17 98.3 F (36.8 C) (Oral)   BP Readings from Last 3 Encounters:  05/31/18 138/72  10/13/17 (!) 150/88  10/13/17 (!) 148/88   Pulse Readings from Last 3 Encounters:  05/31/18 96  10/13/17 84  10/13/17 84    Physical Exam Vitals signs and nursing note reviewed.  Constitutional:      Appearance: Normal appearance. She is well-developed and well-groomed. She is obese.  HENT:     Head: Normocephalic and atraumatic.     Nose: Nose normal.     Mouth/Throat:     Mouth: Mucous membranes are moist.     Pharynx: Oropharynx is clear.  Eyes:     Conjunctiva/sclera: Conjunctivae normal.     Pupils: Pupils are equal, round, and reactive to light.  Cardiovascular:     Rate and Rhythm: Normal rate and regular rhythm.     Heart sounds: Normal heart sounds. No murmur.  Pulmonary:     Effort: Pulmonary effort is normal.     Breath sounds: Normal breath sounds.  Abdominal:     General: Bowel sounds are normal. There is distension.     Tenderness: There is generalized abdominal tenderness. There is no guarding or rebound.     Comments: Tense abdomen   Skin:    General: Skin  is warm and dry.   Neurological:     General: No focal deficit present.     Mental Status: She is alert and oriented to person, place, and time. Mental status is at baseline.     Gait: Gait normal.  Psychiatric:        Attention and Perception: Attention and perception normal.        Mood and Affect: Mood and affect normal.        Speech: Speech normal.        Behavior: Behavior normal. Behavior is cooperative.        Thought Content: Thought content normal.        Cognition and Memory: Cognition and memory normal.        Judgment: Judgment normal.     Assessment   1. HTN  2. DM 2 A1C 6.7 05/24/2018  3. Abdomen generalized pain radiating to buttocks, legs and knees R/o ab/GU etiology vs MSK etiology  4. HM 5. Asthma controlled  6. Elevated TSH 4.570 though continues to trend down 05/18/17 was 5.190 all other thyroid labs normal  Plan  1. Cont meds  2. Labs had 05/24/2018  Cont meds A1C at goal  UTD eye exam has cataracts f/u eye MD consult with Dr. Tommy Rainwater 07/04/2018  Will do foot exam at f/u  Urine had 12/21/17 normal for microalb/creat ratio 3. Will do CT ab/pelvis with contrast and low back Xray  Pt to walk in for T spine and lumbar Xray  Check CPK [facility] form given today r/o myositis on lipitor 80 mg qhs She wants to wait until 06/19/2018 to do CT ab/pelvis due to elections  4.  Flu shot utd  prevnar had 12/18/15  shingrix 2/2 doses utd  Consider pna 23 and Tdap in future  rec hep B vaccine in the past negative hep B ab qualitative 12/18/15   Repeat tsh mild subclinical hypothyroidism prev labs [facility] form given -consider thyroid US in future h/o 07/2014 elevated TSH  Subclinical hypothyroidism hcv neg 08/19/15   Pap neg 07/2014 [facility] and out of age window pap   02/15/18 mammogram negative  Referred [facility] colonoscopy h/o polyps she did do cologuard 01/08/16 negative but with h/o polyps colonoscopy preferred will discuss again   DEXA normal 07/2014   Former smoker quit 40 years ago  low risk and does not meet criteria see below   12/24/15 mild abdominal aortic ectasia 2.6 cm rec f/u US in 5 years also with 1.3 simple cyst right kidney and tiny simple cysts left lobe of liver  5. Refilled all meds  6. Monitor  If continues to be abnormal consider thyroid US   Of note 07/04/18 cataract consult with Dr. Tommy Rainwater in [place]   Provider: Dr. Olivia Mackie McLean-Scocuzza-Internal Medicine

## 2018-06-05 ENCOUNTER — Encounter: Payer: Self-pay | Admitting: Internal Medicine

## 2018-06-08 ENCOUNTER — Ambulatory Visit (INDEPENDENT_AMBULATORY_CARE_PROVIDER_SITE_OTHER): Payer: Medicare Other

## 2018-06-08 DIAGNOSIS — M545 Low back pain, unspecified: Secondary | ICD-10-CM

## 2018-06-08 DIAGNOSIS — M549 Dorsalgia, unspecified: Secondary | ICD-10-CM

## 2018-06-09 ENCOUNTER — Telehealth: Payer: Self-pay | Admitting: Internal Medicine

## 2018-06-09 NOTE — Telephone Encounter (Signed)
Pt would like a cb regarding her xray results from 3/5. Pt cb [phone]

## 2018-06-19 ENCOUNTER — Ambulatory Visit: Admission: RE | Admit: 2018-06-19 | Payer: Medicare Other | Source: Ambulatory Visit

## 2018-06-27 ENCOUNTER — Telehealth: Payer: Self-pay | Admitting: Internal Medicine

## 2018-06-27 MED ORDER — ACCU-CHEK GUIDE W/DEVICE KIT: 1.0000 | PACK | Freq: Once | 0 refills | Status: AC

## 2018-06-27 NOTE — Telephone Encounter (Signed)
Patient called and stated that 1. Her accu check meter is broken and would like a new one sent to her pharmacy. That is complete. 2. She stated that she is having lower back pain that has been discussed in regards to her arthritis. She wants pain medication sent in for her. Please call patient to advise on her next steps

## 2018-06-28 ENCOUNTER — Other Ambulatory Visit: Payer: Self-pay | Admitting: Internal Medicine

## 2018-06-28 DIAGNOSIS — E119 Type 2 diabetes mellitus without complications: Secondary | ICD-10-CM

## 2018-06-28 DIAGNOSIS — R109 Unspecified abdominal pain: Secondary | ICD-10-CM

## 2018-06-28 DIAGNOSIS — M546 Pain in thoracic spine: Secondary | ICD-10-CM

## 2018-06-28 MED ORDER — OXYCODONE-ACETAMINOPHEN 5-325 MG PO TABS: 1.0000 | ORAL_TABLET | Freq: Two times a day (BID) | ORAL | 0 refills | Status: DC | PRN

## 2018-06-28 MED ORDER — BLOOD GLUCOSE METER KIT: PACK | 0 refills | Status: DC

## 2018-06-28 NOTE — Telephone Encounter (Signed)
Fransisco Beau mail Xray report to this patient please   Melissa please given # to reschedule her CT abdomen/pelvis or reschedule it for her and inform   Thanks [place]

## 2018-06-28 NOTE — Telephone Encounter (Signed)
She was scheduled on 3/16. She cancelled the appointment stating that she was not aware of the appointment and that she would call back to reschedule it.  I did inform her of her appointment on 3/16 when I called her on 3/6-that is when she requested the results of her xray on 3/5.

## 2018-06-28 NOTE — Telephone Encounter (Signed)
She can take Tylenol 500 mg up to 6x per day as needed for back pain arthritis in middle back I can only send in a 5 day supply of pain medications  We need to do CT scan of her abdomen as Ive ordered please schedule   Thanks TSM

## 2018-07-03 ENCOUNTER — Other Ambulatory Visit: Payer: Self-pay | Admitting: Internal Medicine

## 2018-07-03 ENCOUNTER — Telehealth: Payer: Self-pay

## 2018-07-03 DIAGNOSIS — M546 Pain in thoracic spine: Secondary | ICD-10-CM

## 2018-07-03 DIAGNOSIS — R109 Unspecified abdominal pain: Secondary | ICD-10-CM

## 2018-07-03 MED ORDER — OXYCODONE-ACETAMINOPHEN 5-325 MG PO TABS: 1.0000 | ORAL_TABLET | Freq: Two times a day (BID) | ORAL | 0 refills | Status: DC | PRN

## 2018-07-03 NOTE — Telephone Encounter (Signed)
I can only do another 10 pills due to pain medication laws  We need to do the CT scan asap after CT becomes available again to work up and see whats going on in her belly and back  Inform p t  [place]

## 2018-07-03 NOTE — Telephone Encounter (Signed)
Patient would like to have some medication for her back.  CT cancelled patients appointment to have done due to coronavirus.  Patients blood glucose is doing better as per patient.

## 2018-07-03 NOTE — Telephone Encounter (Signed)
Patient has been informed.

## 2018-07-21 ENCOUNTER — Other Ambulatory Visit: Payer: Self-pay | Admitting: Internal Medicine

## 2018-07-21 ENCOUNTER — Telehealth: Payer: Self-pay | Admitting: Internal Medicine

## 2018-07-21 DIAGNOSIS — J4520 Mild intermittent asthma, uncomplicated: Secondary | ICD-10-CM

## 2018-07-21 DIAGNOSIS — R109 Unspecified abdominal pain: Secondary | ICD-10-CM

## 2018-07-21 MED ORDER — FLUTICASONE-SALMETEROL 250-50 MCG/DOSE IN AEPB: INHALATION_SPRAY | RESPIRATORY_TRACT | 11 refills | Status: DC

## 2018-07-21 NOTE — Telephone Encounter (Signed)
patient states the pain is more so her back but she will take a ct.

## 2018-07-21 NOTE — Telephone Encounter (Signed)
Call pt make sure once imaging gets scanned or if she is out in public she wears a mask cloth is ok as long as light doesn't come in   Tracy Torres

## 2018-07-21 NOTE — Telephone Encounter (Signed)
Left detailed message to inform patient.

## 2018-07-21 NOTE — Telephone Encounter (Signed)
Please sch CT ab/pelvis with contrast  Thanks [place]

## 2018-07-21 NOTE — Telephone Encounter (Signed)
Call pt does she want to get CT abdomen pelvis scheduled for her abdominal pain which radiates downward?   [place]

## 2018-07-26 ENCOUNTER — Other Ambulatory Visit: Payer: Self-pay | Admitting: Internal Medicine

## 2018-07-26 ENCOUNTER — Telehealth: Payer: Self-pay | Admitting: Internal Medicine

## 2018-07-26 DIAGNOSIS — R1084 Generalized abdominal pain: Secondary | ICD-10-CM

## 2018-07-26 DIAGNOSIS — M549 Dorsalgia, unspecified: Secondary | ICD-10-CM

## 2018-07-26 NOTE — Telephone Encounter (Signed)
Has CT abdomen pelvis been scheduled? She has been having abdominal pain and back pain x months   Thanks [place]

## 2018-07-26 NOTE — Telephone Encounter (Signed)
Ordered as stat  Will need Creatinine as well stat at the hosp.    Thanks tMS

## 2018-07-26 NOTE — Telephone Encounter (Signed)
No, not yet. It will now be scheduled out to June since the COVID-19. Is that ok? If not, it will need to be re-ordered as stat. Thanks [employer]

## 2018-07-28 ENCOUNTER — Ambulatory Visit: Payer: Medicare Other

## 2018-07-28 ENCOUNTER — Telehealth: Payer: Self-pay | Admitting: Internal Medicine

## 2018-07-28 ENCOUNTER — Telehealth: Payer: Self-pay | Admitting: *Deleted

## 2018-07-28 NOTE — Telephone Encounter (Signed)
Patient called, left VM to return call to the office.

## 2018-07-28 NOTE — Telephone Encounter (Signed)
Patient called and I advised she already spoke to [place], [place]. She says after talking to Ass[place]ta Curtis called and left a message to call him back, so she wants a call from [place]. I advised it will be Monday.

## 2018-07-28 NOTE — Telephone Encounter (Signed)
Patient doesn't think the ct will be beneficial, why not skip that and get the MRI?  She believes it is only her back.  She really does not want to get CT of abdomen. She is ok with it if we must get CT.  Patient wants her opinion to be known

## 2018-07-28 NOTE — Telephone Encounter (Signed)
Copied from [place] [id number]. Topic: Referral - Request for Referral >> Jul 28, 2018 11:23 AM Scherrie Gerlach wrote: Pt states after thinking about it, she would prefer to have a MRI of her back.  She states her pain is in the back, and MRI will show more. Pt states she feels like even after a CT scan, she will have to have a MRI anyway. Pt would like to cancel the CT scan, and have a MRI please.

## 2018-07-28 NOTE — Telephone Encounter (Signed)
Copied from [place] [id number]. Topic: Quick Communication - See Telephone Encounter >> Jul 28, 2018  4:27 PM Blase Mess A wrote: CRM for notification. See Telephone encounter for: 07/28/18. Patient called back to speak to [facility]. The office is closed. Had to patient on hold to speak to [facility] the NT- Patient hung up. Please advise Thank you.

## 2018-07-28 NOTE — Telephone Encounter (Signed)
Left message for patient to return call back. PEC may give information.  

## 2018-07-28 NOTE — Telephone Encounter (Signed)
Pt calling back and would like someone to call her back. Please advise.

## 2018-07-28 NOTE — Telephone Encounter (Signed)
Advise pt she can have issues going on in abdomen which radiate to back and I want her to please have CT abdomen pelvis to rule out something going on in her belly as last visit it was very uncomfortable as well as her back    CT can evaluate bones to a certain extent and if it is negative we can think about MRI of mid back   I am encouraging her to to the CT ab/pelvis Is she agreeable?

## 2018-07-28 NOTE — Telephone Encounter (Signed)
We can start out with the MRI mid back if she wants but if that is negative then I am still rec. CT ab/pelvis  Inform pt and how does she want to proceed ?   [place]

## 2018-08-02 ENCOUNTER — Other Ambulatory Visit: Payer: Self-pay

## 2018-08-02 ENCOUNTER — Ambulatory Visit: Payer: Self-pay | Admitting: Internal Medicine

## 2018-08-02 ENCOUNTER — Other Ambulatory Visit: Payer: Self-pay | Admitting: Internal Medicine

## 2018-08-02 ENCOUNTER — Encounter: Payer: Self-pay | Admitting: Internal Medicine

## 2018-08-02 ENCOUNTER — Ambulatory Visit
Admission: RE | Admit: 2018-08-02 | Discharge: 2018-08-02 | Disposition: A | Discharge status: Home / Self Care | Payer: Medicare Other | Source: Ambulatory Visit | Attending: Internal Medicine | Admitting: Internal Medicine

## 2018-08-02 DIAGNOSIS — D49512 Neoplasm of unspecified behavior of left kidney: Secondary | ICD-10-CM | POA: Insufficient documentation

## 2018-08-02 DIAGNOSIS — M549 Dorsalgia, unspecified: Secondary | ICD-10-CM | POA: Diagnosis present

## 2018-08-02 DIAGNOSIS — M546 Pain in thoracic spine: Secondary | ICD-10-CM

## 2018-08-02 DIAGNOSIS — R109 Unspecified abdominal pain: Secondary | ICD-10-CM

## 2018-08-02 DIAGNOSIS — N2889 Other specified disorders of kidney and ureter: Secondary | ICD-10-CM

## 2018-08-02 DIAGNOSIS — R918 Other nonspecific abnormal finding of lung field: Secondary | ICD-10-CM

## 2018-08-02 DIAGNOSIS — R911 Solitary pulmonary nodule: Secondary | ICD-10-CM | POA: Insufficient documentation

## 2018-08-02 DIAGNOSIS — K7460 Unspecified cirrhosis of liver: Secondary | ICD-10-CM | POA: Insufficient documentation

## 2018-08-02 DIAGNOSIS — R1084 Generalized abdominal pain: Secondary | ICD-10-CM

## 2018-08-02 DIAGNOSIS — E278 Other specified disorders of adrenal gland: Secondary | ICD-10-CM

## 2018-08-02 LAB — POCT I-STAT CREATININE: Creatinine, Ser: 0.9 mg/dL (ref 0.44–1.00)

## 2018-08-02 MED ORDER — IOHEXOL 300 MG/ML  SOLN
100.0000 mL | Freq: Once | INTRAMUSCULAR | Status: AC | PRN
  Administered 2018-08-02: 100 mL via INTRAVENOUS

## 2018-08-02 MED ORDER — OXYCODONE-ACETAMINOPHEN 5-325 MG PO TABS: 0.5000 | ORAL_TABLET | Freq: Two times a day (BID) | ORAL | 0 refills | Status: DC | PRN

## 2018-08-02 NOTE — Telephone Encounter (Signed)
Tracy Torres with [facility] has a stat CT scan report.   She still has the pt there waiting for further directions due to there being a left renal mass that is concerning for a neoplasm.  I warm transferred the call to [facility] at Dr. McLean-Scocuzza's office.

## 2018-08-08 ENCOUNTER — Other Ambulatory Visit: Payer: Self-pay | Admitting: Internal Medicine

## 2018-08-08 DIAGNOSIS — R918 Other nonspecific abnormal finding of lung field: Secondary | ICD-10-CM

## 2018-08-08 DIAGNOSIS — Z87891 Personal history of nicotine dependence: Secondary | ICD-10-CM

## 2018-08-08 DIAGNOSIS — C642 Malignant neoplasm of left kidney, except renal pelvis: Secondary | ICD-10-CM

## 2018-08-08 DIAGNOSIS — E279 Disorder of adrenal gland, unspecified: Secondary | ICD-10-CM

## 2018-08-10 NOTE — Telephone Encounter (Signed)
Can they just schedule the appt with h/o who I referred her to medical?  There is no pathology/biopsy we are doing a PET scan and CT chest   Please just have them schedule the appt with a medical oncologist and one of the names I referred her to was medical oncology    This has been a week of back and forth calls and request the doctor me requests appt be scheduled asap   Tracy Torres

## 2018-08-10 NOTE — Telephone Encounter (Signed)
Tracy Torres from [facility] called needing a pathology report for her kidney cancer. Informed her that we have not done any pathology on her, just imaging. She wants to know if she has had kidney cancer prior and if she did when she had it. I will need to call her back to give her the information. [phone] option 1.

## 2018-08-14 ENCOUNTER — Telehealth: Payer: Self-pay

## 2018-08-14 ENCOUNTER — Ambulatory Visit: Admission: RE | Admit: 2018-08-14 | Payer: Medicare Other | Source: Ambulatory Visit

## 2018-08-14 NOTE — Telephone Encounter (Signed)
She already had a CT ab/pelvis on 08/02/2018  What are they meaning she was supposed to have CT chest and PET scan this is what was ordered  Please reschedule the above  What is going on?  Call imaging asap   [place]

## 2018-08-15 ENCOUNTER — Ambulatory Visit
Admission: RE | Admit: 2018-08-15 | Discharge: 2018-08-15 | Disposition: A | Discharge status: Home / Self Care | Payer: Medicare Other | Source: Ambulatory Visit | Attending: Internal Medicine | Admitting: Internal Medicine

## 2018-08-15 ENCOUNTER — Other Ambulatory Visit: Payer: Self-pay

## 2018-08-15 DIAGNOSIS — E279 Disorder of adrenal gland, unspecified: Secondary | ICD-10-CM | POA: Insufficient documentation

## 2018-08-15 DIAGNOSIS — Z87891 Personal history of nicotine dependence: Secondary | ICD-10-CM | POA: Diagnosis present

## 2018-08-15 DIAGNOSIS — C642 Malignant neoplasm of left kidney, except renal pelvis: Secondary | ICD-10-CM | POA: Insufficient documentation

## 2018-08-15 DIAGNOSIS — R918 Other nonspecific abnormal finding of lung field: Secondary | ICD-10-CM | POA: Diagnosis present

## 2018-08-15 MED ORDER — IOHEXOL 300 MG/ML  SOLN
75.0000 mL | Freq: Once | INTRAMUSCULAR | Status: AC | PRN
  Administered 2018-08-15: 75 mL via INTRAVENOUS

## 2018-08-16 ENCOUNTER — Telehealth: Payer: Self-pay | Admitting: *Deleted

## 2018-08-16 NOTE — Telephone Encounter (Signed)
Please keep me posted   [place]

## 2018-08-16 NOTE — Telephone Encounter (Signed)
Info has been faxed electronically

## 2018-08-16 NOTE — Telephone Encounter (Signed)
Error

## 2018-08-16 NOTE — Telephone Encounter (Signed)
I do not understand this as well. She was scheduled for CT Chest on 5/11-which is the one that she rescheduled to 5/12. I believe she misread her appointment. Her PET is scheduled for 5/19. Melissa

## 2018-08-16 NOTE — Telephone Encounter (Signed)
Trying my best to get her scheduled. Spoke to [place] at [place] states that her Tracy Torres is working on [employer] on provider to let her know how soon to get her in. Informed her that the referral was sent on 4/30 as urgent and patient is needing to be scheduled asap.

## 2018-08-16 NOTE — Telephone Encounter (Signed)
Copied from [place] [id number]. Topic: General - Other >> Aug 16, 2018 10:14 AM Antonieta Iba C wrote: Reason for CRM: [facility] received a referral for pt. They are requesting a copy of pt's most recent images.  Phone: [phone] opt 1    Fax: [phone]

## 2018-08-17 ENCOUNTER — Telehealth: Payer: Self-pay | Admitting: Internal Medicine

## 2018-08-17 NOTE — Telephone Encounter (Signed)
Melissa please fax all CT chest/abdomen and pelvis to [facility] for referral and copy of last note and recent labs  -can you call today about appt again??? I wanted Dr. Moul/Polascik/Ferrandino or Dr. 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 They are requesting a copy of pt's most recent images.  Phone: [phone] opt 1    Fax: [phone]

## 2018-08-18 ENCOUNTER — Telehealth: Payer: Self-pay | Admitting: Internal Medicine

## 2018-08-18 NOTE — Telephone Encounter (Signed)
Pt scheduled 08/21/2018 with Dr. Voncille Lo urology

## 2018-08-22 ENCOUNTER — Other Ambulatory Visit: Payer: Self-pay

## 2018-08-22 ENCOUNTER — Ambulatory Visit
Admission: RE | Admit: 2018-08-22 | Discharge: 2018-08-22 | Disposition: A | Discharge status: Home / Self Care | Payer: Medicare Other | Source: Ambulatory Visit | Attending: Internal Medicine | Admitting: Internal Medicine

## 2018-08-22 DIAGNOSIS — E279 Disorder of adrenal gland, unspecified: Secondary | ICD-10-CM | POA: Insufficient documentation

## 2018-08-22 DIAGNOSIS — C642 Malignant neoplasm of left kidney, except renal pelvis: Secondary | ICD-10-CM | POA: Diagnosis present

## 2018-08-22 DIAGNOSIS — R918 Other nonspecific abnormal finding of lung field: Secondary | ICD-10-CM | POA: Insufficient documentation

## 2018-08-22 DIAGNOSIS — D259 Leiomyoma of uterus, unspecified: Secondary | ICD-10-CM | POA: Diagnosis not present

## 2018-08-22 LAB — GLUCOSE, CAPILLARY: Glucose-Capillary: 93 mg/dL (ref 70–99)

## 2018-08-22 MED ORDER — FLUDEOXYGLUCOSE F - 18 (FDG) INJECTION
9.2300 | Freq: Once | INTRAVENOUS | Status: AC | PRN
  Administered 2018-08-22: 9.23 via INTRAVENOUS

## 2018-08-30 ENCOUNTER — Ambulatory Visit: Payer: Medicare Other | Admitting: Internal Medicine

## 2018-08-30 ENCOUNTER — Other Ambulatory Visit: Payer: Self-pay

## 2018-08-30 ENCOUNTER — Ambulatory Visit (INDEPENDENT_AMBULATORY_CARE_PROVIDER_SITE_OTHER): Payer: Medicare Other | Admitting: Internal Medicine

## 2018-08-30 DIAGNOSIS — K760 Fatty (change of) liver, not elsewhere classified: Secondary | ICD-10-CM | POA: Insufficient documentation

## 2018-08-30 DIAGNOSIS — E119 Type 2 diabetes mellitus without complications: Secondary | ICD-10-CM

## 2018-08-30 DIAGNOSIS — R911 Solitary pulmonary nodule: Secondary | ICD-10-CM | POA: Diagnosis not present

## 2018-08-30 DIAGNOSIS — D259 Leiomyoma of uterus, unspecified: Secondary | ICD-10-CM

## 2018-08-30 DIAGNOSIS — D72829 Elevated white blood cell count, unspecified: Secondary | ICD-10-CM

## 2018-08-30 DIAGNOSIS — K5790 Diverticulosis of intestine, part unspecified, without perforation or abscess without bleeding: Secondary | ICD-10-CM | POA: Insufficient documentation

## 2018-08-30 DIAGNOSIS — M549 Dorsalgia, unspecified: Secondary | ICD-10-CM | POA: Diagnosis not present

## 2018-08-30 DIAGNOSIS — I10 Essential (primary) hypertension: Secondary | ICD-10-CM

## 2018-08-30 DIAGNOSIS — H269 Unspecified cataract: Secondary | ICD-10-CM | POA: Insufficient documentation

## 2018-08-30 DIAGNOSIS — K7689 Other specified diseases of liver: Secondary | ICD-10-CM | POA: Insufficient documentation

## 2018-08-30 DIAGNOSIS — R946 Abnormal results of thyroid function studies: Secondary | ICD-10-CM

## 2018-08-30 DIAGNOSIS — D49512 Neoplasm of unspecified behavior of left kidney: Secondary | ICD-10-CM

## 2018-08-30 DIAGNOSIS — Z1231 Encounter for screening mammogram for malignant neoplasm of breast: Secondary | ICD-10-CM

## 2018-08-30 NOTE — Progress Notes (Signed)
Telephone Note  I connected with Tracy Torres   on 08/30/18 at  3:30 PM EDT by telephone and verified that I am speaking with the correct person using two identifiers.  Location patient: home Location provider:work or home office Persons participating in the virtual visit: patient, provider  I discussed the limitations of evaluation and management by telemedicine and the availability of in person appointments. The patient expressed understanding and agreed to proceed.   HPI: 1. Left renal mass noted on CT ab/pelvis saw telemed visit Dr. Braulio Bosch 08/21/2018 who referred to CTS Dr. Williemae Natter for bx of pulmonary lesions b/l lungs  -will fax PET scan to [phone]   2. Mid back pain 6/10 worse in the am and she a times has trouble walking reviewed with pt back pain may be due to arthritis changes in mid back noted on imaging and she has percocet left over but has not had to use   3. Fibroids she ultimately wants to see OB/GYN to remove tumors and consider hysterectomy   4. Right eye cataract right eye planned 09/08/18 she has cloudy vision in right eye   5. Reviewed labs 05/24/2018 A1C 6.7 cont meds controlled on DM meds metformin 1000 mg qd she is trying to eat better and drink more water up to drinking 30 oz water   ROS: See pertinent positives and negatives per HPI.  Past Medical History:  Diagnosis Date  . Asthma   . Chicken pox   . CTS (carpal tunnel syndrome)   . Diabetes ([place])   . Fibroids   . History of colon polyps   . History of shingles   . Hypercalcemia    resolved after extra calcium and hctz stopped   . Hypercalcemia    07/2013 stopped calcium and normalized   . Hypertension   . Shingles   . Sinus problem   . Thyroid disease    abnormal thyroid tests TSH elevated 07/2014 Dr. Dionisio David  . Vitamin D deficiency     Past Surgical History:  Procedure Laterality Date  . arm surgery     left arm age [age] y.o   . [place]    . TUBAL LIGATION      Family  History  Problem Relation Age of Onset  . Diabetes Mother   . Heart disease Mother        MI  . Hypertension Mother   . Hyperlipidemia Mother   . Stroke Mother   . Alcohol abuse Father   . Cancer Father   . Hyperlipidemia Sister   . Hypertension Sister   . Alcohol abuse Brother   . Early death Brother   . Alcohol abuse Brother   . Cancer Brother   . Depression Brother   . Hyperlipidemia Brother   . Breast cancer Neg Hx     SOCIAL HX: married 1 daughter    Current Outpatient Medications:  .  albuterol (PROVENTIL HFA;VENTOLIN HFA) 108 (90 Base) MCG/ACT inhaler, Inhale 1-2 puffs into the lungs every 6 (six) hours as needed for wheezing or shortness of breath., Disp: 1 Inhaler, Rfl: 11 .  amLODipine (NORVASC) 10 MG tablet, Take 1 tablet (10 mg total) by mouth daily., Disp: 90 tablet, Rfl: 3 .  Ascorbic Acid (VITAMIN C PO), Take 1 tablet by mouth daily. , Disp: , Rfl:  .  atorvastatin (LIPITOR) 80 MG tablet, Take 1 tablet (80 mg total) by mouth daily., Disp: 90 tablet, Rfl: 3 .  blood glucose  meter kit and supplies, Dispense based on patient and insurance preference. Use up to four times daily as directed. ( E11.9)., Disp: 1 each, Rfl: 0 .  cholecalciferol (VITAMIN D) 1000 UNITS tablet, Take 2,000 Units by mouth daily., Disp: , Rfl:  .  Fluticasone-Salmeterol (ADVAIR DISKUS) 250-50 MCG/DOSE AEPB, INHALE 1 PUFF VIA INHALER TWO TIMES PER DAY, IN THE MORNING AND EVENING, APPROXIMATELY 12 HOURS APART RINSE mouth after each use, Disp: 60 each, Rfl: 11 .  glucose blood (ACCU-CHEK SMARTVIEW) test strip, USE 1 STRIP DAILY, Disp: 100 each, Rfl: 11 .  Lancets (ACCU-CHEK SOFT TOUCH) lancets, Check 1x per day Use as instructed E11.9, Disp: 100 each, Rfl: 12 .  losartan-hydrochlorothiazide (HYZAAR) 100-25 MG tablet, Take 1 tablet by mouth daily., Disp: 90 tablet, Rfl: 3 .  metFORMIN (GLUCOPHAGE) 1000 MG tablet, Take 1 tablet (1,000 mg total) by mouth daily with breakfast., Disp: 90 tablet, Rfl: 3 .   montelukast (SINGULAIR) 10 MG tablet, Take 1 tablet (10 mg total) by mouth daily. At night, Disp: 90 tablet, Rfl: 3 .  oxyCODONE-acetaminophen (PERCOCET) 5-325 MG tablet, Take 0.5-1 tablets by mouth 2 (two) times daily as needed for severe pain., Disp: 10 tablet, Rfl: 0  EXAM:  VITALS per patient if applicable:  GENERAL: alert, oriented, appears well and in no acute distress  PSYCH/NEURO: pleasant and cooperative, no obvious depression or anxiety, speech and thought processing grossly intact  ASSESSMENT AND PLAN:  Discussed the following assessment and plan:  Uterine leiomyoma, unspecified location-consider ob/gyn referral in future wants hysterectomy  -in future pt wants referral to OB/GYN for hysterectomy   Neoplasm of left kidney -called Dr. Rosetta Posner office today to see the next plan he referred pt to [facility] Dr. Gordan Payment for lung bx with b/l pulm nodules but she has not heard from this office  -she will also need tissue bx of left kidneys  -pt is anxious about plan and f/u  -will re fax PET scan to urology [phone]  B/l Pulmonary nodule PET scan not c/w mets but CT chest ? Mets  -see above called Dr.Ferrandino to get the next plan   Mid back pain etiology could be due to Thoracic arthritis vs referred pain from left kidney mass  -prn percocet if needed otherwise tylenol   Cataract right eye  -eye surgery 09/08/2018 pending she may reschedule   HM Labs had 05/24/2018  Flu shot utd  prevnar had 12/18/15  shingrix 2/2 doses utd  Consider pna 23 and Tdap in future  rec hep B vaccine in the past negative hep B ab qualitative 12/18/15   Of note elevated TSH 05/24/2018   elevated TSH Subclinical hypothyroidism-repeat TSH in the future  hcv neg 08/19/15   Pap neg 07/2014 [facility] and out of age window pap   02/15/18 mammogram negative referred today    colonoscopy h/o polyps she did do cologuard 01/08/16 negative but with h/o polyps colonoscopy preferred she is agreeable  in the future seen [facility] in the past   DEXA normal 07/2014  Former smoker quit 40 years ago low risk and does not meet criteria see below     I discussed the assessment and treatment plan with the patient. The patient was provided an opportunity to ask questions and all were answered. The patient agreed with the plan and demonstrated an understanding of the instructions.   The patient was advised to call back or seek an in-person evaluation if the symptoms worsen or if the  condition fails to improve as anticipated.  Time spent 20 minutes  Delorise Jackson, MD

## 2018-08-31 NOTE — Progress Notes (Signed)
Note sent

## 2018-09-01 ENCOUNTER — Telehealth: Payer: Self-pay | Admitting: *Deleted

## 2018-09-01 NOTE — Telephone Encounter (Signed)
Copied from [place] [phone]. Topic: General - Inquiry >> Sep 01, 2018  9:19 AM Scherrie Gerlach wrote: Reason for CRM:  pt wants Dr Linus Orn to know they called her from [place] this am.  She has appt at [facility] 09/11/18 at 23 am.  Dr Williemae Natter for breathing treatment and 11 am at the cancer center.

## 2018-09-15 ENCOUNTER — Telehealth: Payer: Self-pay

## 2018-09-15 NOTE — Telephone Encounter (Signed)
Copied from [place] [phone]. Topic: General - Other >> Sep 15, 2018  1:54 PM Nils Flack wrote: CRM for notification. See Telephone encounter for: 09/15/18. Pt calling with update - she went to [facility] yesterday.  They want to do biopsy on lung on tues.  She is very happy with the specialist and thanks you for finding wonderful people to take care of her

## 2018-09-18 ENCOUNTER — Encounter: Payer: Self-pay | Admitting: Internal Medicine

## 2018-09-20 MED ORDER — PREPARATION H TOTABLES 1-0.25-14.4-15 % RE CREA
2.00 | TOPICAL_CREAM | RECTAL | Status: DC
Start: 2018-09-20 — End: 2018-09-20

## 2018-09-20 MED ORDER — HEPARIN SODIUM (PORCINE) 5000 UNIT/ML IJ SOLN
5000.00 | INTRAMUSCULAR | Status: DC
Start: 2018-09-20 — End: 2018-09-20

## 2018-09-20 MED ORDER — RA GLUCOSAMINE-CHONDROITIN DS 500-400 MG PO TABS
100.00 | ORAL_TABLET | ORAL | Status: DC
Start: 2018-09-20 — End: 2018-09-20

## 2018-09-20 MED ORDER — DICLOXACILLIN SODIUM 62.5 MG/5ML PO SUSR
10.00 | ORAL | Status: DC
Start: ? — End: 2018-09-20

## 2018-09-20 MED ORDER — ARMOUR THYROID 30 MG PO TABS
2.00 | ORAL_TABLET | ORAL | Status: DC
Start: ? — End: 2018-09-20

## 2018-09-20 MED ORDER — DEXTROSE 50 % IV SOLN
12.50 | INTRAVENOUS | Status: DC
Start: ? — End: 2018-09-20

## 2018-09-20 MED ORDER — Medication
2.00 | Status: DC
Start: 2018-09-20 — End: 2018-09-20

## 2018-09-20 MED ORDER — METFORMIN HCL 500 MG PO TABS
1000.00 | ORAL_TABLET | ORAL | Status: DC
Start: 2018-09-21 — End: 2018-09-20

## 2018-09-20 MED ORDER — GLUCAGON HCL RDNA (DIAGNOSTIC) 1 MG IJ SOLR
1.00 | INTRAMUSCULAR | Status: DC
Start: ? — End: 2018-09-20

## 2018-09-20 MED ORDER — HORIZON PUMP SYRINGE ADD-ON MISC
10.00 | Status: DC
Start: ? — End: 2018-09-20

## 2018-09-20 MED ORDER — CVS CALCIUM CARBONATE PO
400.00 | ORAL | Status: DC
Start: 2018-09-20 — End: 2018-09-20

## 2018-09-20 MED ORDER — INSULIN REGULAR HUMAN 100 UNIT/ML IJ SOLN
0.00 | INTRAMUSCULAR | Status: DC
Start: 2018-09-20 — End: 2018-09-20

## 2018-09-20 MED ORDER — VICON FORTE PO CAPS
17.00 | ORAL_CAPSULE | ORAL | Status: DC
Start: ? — End: 2018-09-20

## 2018-09-20 MED ORDER — Medication
5.00 | Status: DC
Start: ? — End: 2018-09-20

## 2018-09-20 MED ORDER — ATORVASTATIN CALCIUM 80 MG PO TABS
80.00 | ORAL_TABLET | ORAL | Status: DC
Start: ? — End: 2018-09-20

## 2018-09-20 MED ORDER — ACETAMINOPHEN 325 MG PO TABS
650.00 | ORAL_TABLET | ORAL | Status: DC
Start: 2018-09-20 — End: 2018-09-20

## 2018-09-20 MED ORDER — ONDANSETRON HCL 4 MG/2ML IJ SOLN
4.00 | INTRAMUSCULAR | Status: DC
Start: ? — End: 2018-09-20

## 2018-09-20 MED ORDER — GENERIC EXTERNAL MEDICATION
Status: DC
Start: ? — End: 2018-09-20

## 2018-09-20 MED ORDER — TUMS LASTING EFFECTS PO
30.00 | ORAL | Status: DC
Start: ? — End: 2018-09-20

## 2018-10-10 ENCOUNTER — Telehealth: Payer: Self-pay

## 2018-10-10 DIAGNOSIS — E119 Type 2 diabetes mellitus without complications: Secondary | ICD-10-CM

## 2018-10-10 DIAGNOSIS — I10 Essential (primary) hypertension: Secondary | ICD-10-CM

## 2018-10-10 NOTE — Telephone Encounter (Signed)
Copied from [place] [id number]. Topic: Quick Communication - Rx Refill/Question >> Oct 10, 2018  4:57 PM Parke Poisson wrote: Medication: losartan-hydrochlorothiazide (HYZAAR) 100-25 MG tablet  2)atorvastatin (LIPITOR) 80 MG tablet   Has the patient contacted their pharmacy? Yes  (Agent: If yes, when and what did the pharmacy advise?) No response from office  Preferred Pharmacy (with phone number or street name): [id number], [place] - [place] [phone] (Phone) [phone] (Fax)    Agent: Please be advised that RX refills may take up to 3 business days. We ask that you follow-up with your pharmacy.

## 2018-10-11 MED ORDER — LOSARTAN POTASSIUM-HCTZ 100-25 MG PO TABS: 1.0000 | ORAL_TABLET | Freq: Every day | ORAL | 3 refills | Status: DC

## 2018-10-11 MED ORDER — ATORVASTATIN CALCIUM 80 MG PO TABS: 80.0000 mg | ORAL_TABLET | Freq: Every day | ORAL | 3 refills | Status: DC

## 2018-10-18 ENCOUNTER — Encounter: Payer: Self-pay | Admitting: Internal Medicine

## 2018-10-18 DIAGNOSIS — D3A090 Benign carcinoid tumor of the bronchus and lung: Secondary | ICD-10-CM | POA: Insufficient documentation

## 2019-01-01 ENCOUNTER — Telehealth: Payer: Self-pay

## 2019-01-01 NOTE — Telephone Encounter (Signed)
Copied from [place] [phone]. Topic: General - Inquiry >> Jan 01, 2019  9:28 AM Mathis Bud wrote: Reason for CRM: Patient would like lab order sent to [facility] in [place].  Patient would like it done before appt on 10/20 Call back # [phone]

## 2019-01-01 NOTE — Addendum Note (Signed)
Addended by: Orland Mustard on: 01/01/2019 09:26 PM   Modules accepted: Orders

## 2019-01-01 NOTE — Telephone Encounter (Signed)
Labs in  Advise pt only [facility] locations are  1. [place]  2. [address]  [place]   They closed [address] location   Please be fasting x 8-12 hours nothing but water and meds   [place]

## 2019-01-02 NOTE — Telephone Encounter (Signed)
Left message for patient to return call back. PEC may give information.  

## 2019-01-04 ENCOUNTER — Telehealth: Payer: Self-pay | Admitting: Internal Medicine

## 2019-01-04 NOTE — Telephone Encounter (Signed)
Note read to patient. She is requesting orders be sent to her home address and "I can carry them with me to lab."  CB# [phone] Verified home address

## 2019-01-04 NOTE — Telephone Encounter (Signed)
Labs are [facility] mailed

## 2019-01-15 ENCOUNTER — Ambulatory Visit: Payer: Medicare Other | Admitting: Internal Medicine

## 2019-01-15 ENCOUNTER — Ambulatory Visit: Payer: Medicare Other

## 2019-01-16 ENCOUNTER — Telehealth: Payer: Self-pay | Admitting: Internal Medicine

## 2019-01-16 NOTE — Telephone Encounter (Signed)
Spoke to pt about her upcoming [facility] appt.  Pt states she has not received her lab order in the mail so that she can take it to [place] and have blood drawn before next Tuesday.   Pt states she can pick order up at office incase it takes to long to get it in the mail. She does not want to have her appt with PCP unless her blood work is done first.  Please advise

## 2019-01-16 NOTE — Telephone Encounter (Signed)
Left message for patient to return call back. PEC may give information. Lab orders have been placed up front for pickup. She does not need to come get orders. The [facility] in [facility] ordered facility.

## 2019-01-17 LAB — HM DIABETES EYE EXAM

## 2019-01-18 ENCOUNTER — Encounter: Payer: Self-pay | Admitting: Internal Medicine

## 2019-01-20 LAB — COMPREHENSIVE METABOLIC PANEL
ALT: 16 IU/L (ref 0–32)
AST: 14 IU/L (ref 0–40)
Albumin/Globulin Ratio: 1.6 (ref 1.2–2.2)
Albumin: 4.4 g/dL (ref 3.7–4.7)
Alkaline Phosphatase: 82 IU/L (ref 39–117)
BUN/Creatinine Ratio: 15 (ref 12–28)
BUN: 13 mg/dL (ref 8–27)
Bilirubin Total: 0.4 mg/dL (ref 0.0–1.2)
CO2: 24 mmol/L (ref 20–29)
Calcium: 10.4 mg/dL — ABNORMAL HIGH (ref 8.7–10.3)
Chloride: 100 mmol/L (ref 96–106)
Creatinine, Ser: 0.88 mg/dL (ref 0.57–1.00)
GFR calc Af Amer: 75 mL/min/{1.73_m2} (ref >59)
GFR calc non Af Amer: 65 mL/min/{1.73_m2} (ref >59)
Globulin, Total: 2.8 g/dL (ref 1.5–4.5)
Glucose: 97 mg/dL (ref 65–99)
Potassium: 4.1 mmol/L (ref 3.5–5.2)
Sodium: 140 mmol/L (ref 134–144)
Total Protein: 7.2 g/dL (ref 6.0–8.5)

## 2019-01-20 LAB — CBC WITH DIFFERENTIAL/PLATELET
Basophils Absolute: 0.1 10*3/uL (ref 0.0–0.2)
Basos: 1 %
EOS (ABSOLUTE): 0.4 10*3/uL (ref 0.0–0.4)
Eos: 6 %
Hematocrit: 39.7 % (ref 34.0–46.6)
Hemoglobin: 13.2 g/dL (ref 11.1–15.9)
Immature Grans (Abs): 0.1 10*3/uL (ref 0.0–0.1)
Immature Granulocytes: 1 %
Lymphocytes Absolute: 1.8 10*3/uL (ref 0.7–3.1)
Lymphs: 30 %
MCH: 29.5 pg (ref 26.6–33.0)
MCHC: 33.2 g/dL (ref 31.5–35.7)
MCV: 89 fL (ref 79–97)
Monocytes Absolute: 0.5 10*3/uL (ref 0.1–0.9)
Monocytes: 8 %
Neutrophils Absolute: 3.3 10*3/uL (ref 1.4–7.0)
Neutrophils: 54 %
Platelets: 312 10*3/uL (ref 150–450)
RBC: 4.47 x10E6/uL (ref 3.77–5.28)
RDW: 14.3 % (ref 11.7–15.4)
WBC: 6.1 10*3/uL (ref 3.4–10.8)

## 2019-01-20 LAB — LIPID PANEL
Chol/HDL Ratio: 3.8 ratio (ref 0.0–4.4)
Cholesterol, Total: 152 mg/dL (ref 100–199)
HDL: 40 mg/dL (ref >39)
LDL Chol Calc (NIH): 96 mg/dL (ref 0–99)
Triglycerides: 81 mg/dL (ref 0–149)
VLDL Cholesterol Cal: 16 mg/dL (ref 5–40)

## 2019-01-20 LAB — HEMOGLOBIN A1C
Est. average glucose Bld gHb Est-mCnc: 143 mg/dL
Hgb A1c MFr Bld: 6.6 % — ABNORMAL HIGH (ref 4.8–5.6)

## 2019-01-20 LAB — TSH: TSH: 2.62 u[IU]/mL (ref 0.450–4.500)

## 2019-01-20 LAB — MICROALBUMIN / CREATININE URINE RATIO
Creatinine, Urine: 134 mg/dL
Microalb/Creat Ratio: 9 mg/g creat (ref 0–29)
Microalbumin, Urine: 11.5 ug/mL

## 2019-01-23 ENCOUNTER — Telehealth: Payer: Self-pay | Admitting: *Deleted

## 2019-01-23 ENCOUNTER — Other Ambulatory Visit: Payer: Self-pay

## 2019-01-23 ENCOUNTER — Ambulatory Visit (INDEPENDENT_AMBULATORY_CARE_PROVIDER_SITE_OTHER): Payer: Medicare Other

## 2019-01-23 ENCOUNTER — Ambulatory Visit (INDEPENDENT_AMBULATORY_CARE_PROVIDER_SITE_OTHER): Payer: Medicare Other | Admitting: Internal Medicine

## 2019-01-23 VITALS — BP 142/82 | HR 105 | Temp 97.3°F | Ht 59.5 in | Wt 177.0 lb

## 2019-01-23 DIAGNOSIS — D3A090 Benign carcinoid tumor of the bronchus and lung: Secondary | ICD-10-CM

## 2019-01-23 DIAGNOSIS — I10 Essential (primary) hypertension: Secondary | ICD-10-CM

## 2019-01-23 DIAGNOSIS — Z23 Encounter for immunization: Secondary | ICD-10-CM | POA: Diagnosis not present

## 2019-01-23 DIAGNOSIS — E119 Type 2 diabetes mellitus without complications: Secondary | ICD-10-CM

## 2019-01-23 DIAGNOSIS — M549 Dorsalgia, unspecified: Secondary | ICD-10-CM

## 2019-01-23 DIAGNOSIS — D259 Leiomyoma of uterus, unspecified: Secondary | ICD-10-CM

## 2019-01-23 DIAGNOSIS — Z0000 Encounter for general adult medical examination without abnormal findings: Secondary | ICD-10-CM | POA: Diagnosis not present

## 2019-01-23 DIAGNOSIS — D1803 Hemangioma of intra-abdominal structures: Secondary | ICD-10-CM

## 2019-01-23 DIAGNOSIS — C642 Malignant neoplasm of left kidney, except renal pelvis: Secondary | ICD-10-CM

## 2019-01-23 DIAGNOSIS — K7689 Other specified diseases of liver: Secondary | ICD-10-CM

## 2019-01-23 MED ORDER — CARVEDILOL 3.125 MG PO TABS: 3.1250 mg | ORAL_TABLET | Freq: Two times a day (BID) | ORAL | 2 refills | Status: DC

## 2019-01-23 NOTE — Patient Instructions (Addendum)
   List of places in [place] for needle therapy holistic/chiropractor 1. You can call [facility] [phone] or [place] 336 [phone] 2. Dr. Wille Glaser [place] [phone] 3. Dr. Birdie Sons [id number] 4. Dr. Laveda Abbe [id number]. Integrative chiropractor [id number]   pna 23 vaccine due  Consider Tdap pharmacy with flu shot    MRI 11/23/2018  Procedure: MRI Abdomen with and without contrast  Indication: 78 years y/o Female with Liver lesion, < 1cm, [place] nondiagnostic, indeterminate liver lesions on recent CT scan. 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 IV contrast was administered to improve disease detection and further define anatomy.  Findings:  Liver: There is mild diffuse hepatic steatosis. Multiple scattered T2 hyperintense cystic lesions, compatible with cysts. Hepatic segment 3 lesion with rim arterial phase hyperenhancement measures 12 mm, with isointensity on delayed phase imaging and only moderate T2 hypointensity (series 701 image 26, series 11 image 26). Additional smaller lesion is located in the periphery of hepatic segment 3.     Bile Ducts: No intrahepatic or extrahepatic bile duct dilation is noted.  Gallbladder: Normal.  Pancreas and Pancreatic Duct: Pancreas divisum. 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 No pancreatic duct dilation.  Spleen: Normal. Accessory splenule incidentally noted.  Adrenal Glands: Left adrenal nodule measures 15 mm with signal characteristics compatible with an adenoma..  Kidneys: Enhancing, left lower pole 1.9 x 2.2 cm lesion, concerning  for renal cell carcinoma. Right lower pole cyst measures 16 mm with thin, nonenhancing internal septations (Bosniak 2). No hydronephrosis.  Peritoneum/Mesentery/Omentum: Normal.  Lymph Nodes: No abdominal lymphadenopathy is seen.  Visualized Bowel: Grossly normal.  Vessels: Normal.   Bones: Degenerative changes of the spine. No acute fractures or aggressive osseous lesions.  Other Findings:  Small pericardial effusion. Tiny bilateral pleural effusions. Left basilar opacities. Right lower lobe pulmonary nodule. Fibroid uterus.  Colonic diverticulosis.   IMPRESSION:   1. 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 Consider short term interval follow-up in 3 months to ensure stability, given atypical features and clinical concern for metastases.  2. Left lower pole enhancing renal mass measures 2.2 cm, concerning for renal cell carcinoma. 3. Left adrenal nodule, compatible with adenoma. 4. Pulmonary nodules, better appreciated on recent CT chest.   Electronically Reviewed by: Malachy Chamber, MD, [facility] Electronically Reviewed on: 11/22/2018 10:23 AM  I have reviewed the images and concur with the above findings.  Electronically Signed by: Keenan Bachelor, MD, [facility] Electronically Signed on: 11/23/2018 10:53 AM

## 2019-01-23 NOTE — Progress Notes (Addendum)
Subjective:   Tracy Torres is a 73 y.o. female who presents for Medicare Annual (Subsequent) preventive examination.  Review of Systems:  No ROS.  Medicare Wellness Virtual Visit.  Visual/audio telehealth visit, UTA vital signs.   See social history for additional risk factors.   Cardiac Risk Factors include: advanced age (>74mn, >>4women);hypertension;diabetes mellitus     Objective:     Vitals: There were no vitals taken for this visit.  There is no height or weight on file to calculate BMI.  Advanced Directives 01/23/2019 10/13/2017  Does Patient Have a Medical Advance Directive? No No  Would patient like information on creating a medical advance directive? No - Patient declined No - Patient declined    Tobacco Social History   Tobacco Use  Smoking Status Former Smoker  Smokeless Tobacco Never Used     Counseling given: Not Answered   Clinical Intake:  Pre-visit preparation completed: Yes        Diabetes: Yes(Followed by pcp)  How often do you need to have someone help you when you read instructions, pamphlets, or other written materials from your doctor or pharmacy?: 1 - Never  Interpreter Needed?: No     Past Medical History:  Diagnosis Date  . Asthma   . Chicken pox   . CTS (carpal tunnel syndrome)   . Diabetes ([place]   . Fibroids   . History of colon polyps   . History of shingles   . Hypercalcemia    resolved after extra calcium and hctz stopped   . Hypercalcemia    07/2013 stopped calcium and normalized   . Hypertension   . Shingles   . Sinus problem   . Thyroid disease    abnormal thyroid tests TSH elevated 07/2014 Dr. CDionisio David . Vitamin D deficiency    Past Surgical History:  Procedure Laterality Date  . arm surgery     left arm age [age] y.o   . CATARACT EXTRACTION     right eye 09/08/18 Dr.Beavis  . EYE SURGERY    . TUBAL LIGATION     Family History  Problem Relation Age of Onset  . Diabetes Mother   . Heart disease  Mother        MI  . Hypertension Mother   . Hyperlipidemia Mother   . Stroke Mother   . Alcohol abuse Father   . Cancer Father   . Hyperlipidemia Sister   . Hypertension Sister   . Alcohol abuse Brother   . Early death Brother   . Alcohol abuse Brother   . Cancer Brother   . Depression Brother   . Hyperlipidemia Brother   . Breast cancer Neg Hx    Social History   Socioeconomic History  . Marital status: Married    Spouse name: Not on file  . Number of children: Not on file  . Years of education: Not on file  . Highest education level: Not on file  Occupational History  . Not on file  Social Needs  . Financial resource strain: Not hard at all  . Food insecurity    Worry: Never true    Inability: Never true  . Transportation needs    Medical: No    Non-medical: No  Tobacco Use  . Smoking status: Former SResearch scientist (life sciences) . Smokeless tobacco: Never Used  Substance and Sexual Activity  . Alcohol use: No  . Drug use: No  . Sexual activity: Yes    Comment: husband  Lifestyle  . Physical activity    Days per week: 0 days    Minutes per session: Not on file  . Stress: Not at all  Relationships  . Social Herbalist on phone: Not on file    Gets together: Not on file    Attends religious service: Not on file    Active member of club or organization: Not on file    Attends meetings of clubs or organizations: Not on file    Relationship status: Not on file  Other Topics Concern  . 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 albuterol (PROVENTIL HFA;VENTOLIN HFA) 108 (90 Base) MCG/ACT inhaler Inhale 1-2 puffs into the lungs every 6 (six) hours as needed for wheezing or shortness of breath.  Marland Kitchen amLODipine (NORVASC) 10 MG tablet Take 1 tablet (10 mg total) by mouth  daily.  . Ascorbic Acid (VITAMIN C PO) Take 1 tablet by mouth daily.   Marland Kitchen atorvastatin (LIPITOR) 80 MG tablet Take 1 tablet (80 mg total) by mouth daily.  . blood glucose meter kit and supplies Dispense based on patient and insurance preference. Use up to four times daily as directed. ( E11.9).  . cholecalciferol (VITAMIN D) 1000 UNITS tablet Take 2,000 Units by mouth daily.  . Fluticasone-Salmeterol (ADVAIR DISKUS) 250-50 MCG/DOSE AEPB INHALE 1 PUFF VIA INHALER TWO TIMES PER DAY, IN THE MORNING AND EVENING, APPROXIMATELY 12 HOURS APART RINSE mouth after each use  . glucose blood (ACCU-CHEK SMARTVIEW) test strip USE 1 STRIP DAILY  . Lancets (ACCU-CHEK SOFT TOUCH) lancets Check 1x per day Use as instructed E11.9  . losartan-hydrochlorothiazide (HYZAAR) 100-25 MG tablet Take 1 tablet by mouth daily.  . metFORMIN (GLUCOPHAGE) 1000 MG tablet Take 1 tablet (1,000 mg total) by mouth daily with breakfast.  . montelukast (SINGULAIR) 10 MG tablet Take 1 tablet (10 mg total) by mouth daily. At night  . [DISCONTINUED] oxyCODONE-acetaminophen (PERCOCET) 5-325 MG tablet Take 0.5-1 tablets by mouth 2 (two) times daily as needed for severe pain.   No facility-administered encounter medications on file as of 01/23/2019.     Activities of Daily Living In your present state of health, do you have any difficulty performing the following activities: 01/23/2019  Hearing? N  Vision? N  Difficulty concentrating or making decisions? N  Walking or climbing stairs? N  Dressing or bathing? N  Doing errands, shopping? N  Preparing Food and eating ? N  Using the Toilet? N  In the past six months, have you accidently leaked urine? N  Do you have problems with loss of bowel control? N  Managing your Medications? N  Managing your Finances? N  Housekeeping or managing your Housekeeping? N  Some recent data might be hidden    Patient Care Team: McLean-Scocuzza, Nino Glow, MD as PCP - General (Internal Medicine)     Assessment:   This is a routine wellness examination for Tracy Torres.  Nurse connected with patient 01/30/19 at  9:30 AM EDT by a telephone enabled telemedicine application and verified that I am speaking with the correct person using two identifiers. Patient stated full name and DOB. Patient gave permission to continue with virtual visit. Patient's location was at  home and Nurse's location was at [place] office.   Health Maintenance Due: -Influenza vaccine 2020- discussed; to be completed in season with doctor or local pharmacy.   -Tdap- discussed; to be completed with doctor in visit or local pharmacy.   -Foot Exam- followed by pcp -Cologuard- awaiting results ordered through [facility]; [facility] to [facility] Hgb A1c- 01/19/19 (6.6) Update all pending maintenance due as appropriate.   See completed HM at the end of note.   Eye: Visual acuity not assessed. Virtual visit. Wears corrective lenses. Followed by their ophthalmologist every 12 months.  Retinopathy- none reported  Dental: Visits every 6 months.    Hearing: Demonstrates normal hearing during visit.  Safety:  Patient feels safe at home- yes Patient does have smoke detectors at home- yes Patient does wear sunscreen or protective clothing when in direct sunlight - yes Patient does wear seat belt when in a moving vehicle - yes Patient drives- yes Adequate lighting in walkways free from debris- yes Grab bars and handrails used as appropriate- yes Ambulates with no assistive device Cell phone on person when ambulating outside of the home- yes  Social: Alcohol intake - no      Smoking history- former  Smokers in home? none Illicit drug use? none  Depression: PHQ 2 &9 complete. See screening below. Denies irritability, anhedonia, sadness/tearfullness.  Stable.   Falls: See screening below.    Medication: Taking as directed and without issues.   Covid-19: Precautions and sickness symptoms discussed. Wears mask, social distancing,  hand hygiene as appropriate.   Activities of Daily Living Patient denies needing assistance with: household chores, feeding themselves, getting from bed to chair, getting to the toilet, bathing/showering, dressing, managing money, or preparing meals.   Memory: Patient is alert. Patient denies difficulty focusing or concentrating.  BMI- discussed the importance of a healthy diet, water intake and the benefits of aerobic exercise.  Educational material provided.  Physical activity- no routine  Diet: regular Water: good intake Caffeine: 3 cups of coffee  Other Providers Patient Care Team: McLean-Scocuzza, Nino Glow, MD as PCP - General (Internal Medicine)  Exercise Activities and Dietary recommendations Current Exercise Habits: The patient does not participate in regular exercise at present  Goals      Patient Stated   . Increase physical activity (pt-stated)     I am considering going to the [facility] with a friend for water aerobics.       Fall Risk Fall Risk  01/23/2019 01/23/2019 10/13/2017  Falls in the past year? - 0 No  Number falls in past yr: 0 0 -   Timed Get Up and Go performed: no, virtual visit  Depression Screen PHQ 2/9 Scores 01/23/2019 10/13/2017  PHQ - 2 Score 0 0     Cognitive Function     6CIT Screen 01/23/2019 10/13/2017  What Year? 0 points 0 points  What month? 0 points 0 points  What time? 0 points 0 points  Count back from 20 0 points 0 points  Months in reverse 0 points 0 points  Repeat phrase 0 points 0 points  Total Score 0 0    Immunization History  Administered Date(s) Administered  . Influenza Split 02/01/2005, 02/24/2012  . Influenza, High Dose Seasonal PF 03/20/2018  . Pneumococcal Conjugate-13 12/18/2015  . Pneumococcal Polysaccharide-23 02/24/2012, 01/23/2019  . Tdap 05/23/2007  . Zoster Recombinat (Shingrix) 01/05/2018, 03/20/2018   Screening Tests Health Maintenance  Topic Date Due  . FOOT EXAM  08/03/1955  . TETANUS/TDAP  05/22/2017  . Fecal DNA (Cologuard)  01/08/2019  . INFLUENZA VACCINE  07/04/2019 (Originally 11/04/2018)  . HEMOGLOBIN A1C  07/20/2019  . OPHTHALMOLOGY EXAM  01/17/2020  . MAMMOGRAM  02/16/2020  . DEXA SCAN  Completed  . Hepatitis C Screening  Completed  . PNA vac Low Risk Adult  Completed      Plan:   Keep all routine maintenance appointments.   Follow up with your doctor today at 10:30  Medicare Attestation I have personally reviewed: The patient's medical and social history Their use of alcohol, tobacco or illicit drugs Their current medications and supplements The patient's functional ability including ADLs,fall risks, home safety risks, cognitive, and hearing and visual impairment Diet and physical activities Evidence for depression   In addition, I have reviewed and discussed with patient certain preventive protocols, quality metrics, and best practice recommendations. A written personalized care plan for preventive services as well as general preventive health recommendations were provided to patient via mail.     Varney Biles, LPN  [id number]

## 2019-01-23 NOTE — Patient Instructions (Addendum)
  Tracy Torres , Thank you for taking time to come for your Medicare Wellness Visit. I appreciate your ongoing commitment to your health goals. Please review the following plan we discussed and let me know if I can assist you in the future.   These are the goals we discussed: Goals      Patient Stated   . Increase physical activity (pt-stated)     I am considering going to the [facility] with a friend for water aerobics.       This is a list of the screening recommended for you and due dates:  Health Maintenance  Topic Date Due  . Complete foot exam   08/03/1955  . Tetanus Vaccine  05/22/2017  . Cologuard (Stool DNA test)  01/08/2019  . Flu Shot  07/04/2019*  . Hemoglobin A1C  07/20/2019  . Eye exam for diabetics  01/17/2020  . Mammogram  02/16/2020  . DEXA scan (bone density measurement)  Completed  .  Hepatitis C: One time screening is recommended by Center for Disease Control  (CDC) for  adults born from [birth year] through 1965.   Completed  . Pneumonia vaccines  Completed  *Topic was postponed. The date shown is not the original due date.

## 2019-01-23 NOTE — Progress Notes (Addendum)
Chief Complaint  Patient presents with  . Follow-up   F/u  1. HTN uncontrolled on norvasc 10 mg qd, hyzaar 100-25 mg qd 2. Fibroids multiple >5 cm wants fibroid surgery and fibroids out due to making abdomen look bloated  3. 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 Voncille Lo as he wanted to f/u with pt in 1 year. She will have another PET scan by [facility] Dr. Leamon Arnt and consider somatostatin tx or lanreotide tx and f/u 01/30/19 with Dr. Leamon Arnt she also has fatty liver and atypical hemangioma vs ? Mets  4. DM 2 controlled on metformin 6.6 01/19/2019 s/p b/l cataract surgery  5. Mid and low back pain wants to see chiropractor    Review of Systems  Constitutional: Negative for weight loss.  HENT: Negative for hearing loss.   Eyes: Negative for blurred vision.  Respiratory: Negative for shortness of breath.   Cardiovascular: Negative for chest pain.  Gastrointestinal: Negative for abdominal pain.  Musculoskeletal: Positive for back pain and joint pain.  Skin: Negative for rash.  Neurological: Negative for headaches.  Psychiatric/Behavioral: Negative for depression.   Past Medical History:  Diagnosis Date  . Asthma   . Chicken pox   . CTS (carpal tunnel syndrome)   . Diabetes ([place])   . Fibroids   . History of colon polyps   . History of shingles   . Hypercalcemia    resolved after extra calcium and hctz stopped   . Hypercalcemia    07/2013 stopped calcium and normalized   . Hypertension   . Shingles   . Sinus problem   . Thyroid disease    abnormal thyroid tests TSH elevated 07/2014 Dr. Dionisio David  . Vitamin D deficiency    Past Surgical History:  Procedure Laterality Date  . arm surgery     left arm age [age] y.o   . CATARACT EXTRACTION     right eye 09/08/18 Dr.Beavis  . EYE SURGERY    . TUBAL LIGATION     Family History  Problem Relation Age of Onset  . Diabetes Mother   . Heart disease Mother        MI  . Hypertension  Mother   . Hyperlipidemia Mother   . Stroke Mother   . Alcohol abuse Father   . Cancer Father   . Hyperlipidemia Sister   . Hypertension Sister   . Alcohol abuse Brother   . Early death Brother   . Alcohol abuse Brother   . Cancer Brother   . Depression Brother   . Hyperlipidemia Brother   . Breast cancer Neg Hx    Social History   Socioeconomic History  . Marital status: Married    Spouse name: Not on file  . Number of children: Not on file  . Years of education: Not on file  . Highest education level: Not on file  Occupational History  . Not on file  Social Needs  . Financial resource strain: Not hard at all  . Food insecurity    Worry: Never true    Inability: Never true  . Transportation needs    Medical: No    Non-medical: No  Tobacco Use  . Smoking status: Former Research scientist (life sciences)  . Smokeless tobacco: Never Used  Substance and Sexual Activity  . Alcohol use: No  . Drug use: No  . Sexual activity: Yes    Comment: husband  Lifestyle  . Physical activity  Days per week: 0 days    Minutes per session: Not on file  . Stress: Not at all  Relationships  . Social Herbalist on phone: Not on file    Gets together: Not on file    Attends religious service: Not on file    Active member of club or organization: Not on file    Attends meetings of clubs or organizations: Not on file    Relationship status: Not on file  . Intimate partner violence    Fear of current or ex partner: No    Emotionally abused: No    Physically abused: No    Forced sexual activity: No  Other Topics Concern  . 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 albuterol (PROVENTIL HFA;VENTOLIN HFA) 108 (90 Base) MCG/ACT inhaler Inhale 1-2 puffs into the lungs every 6 (six) hours as needed for wheezing or shortness of  breath.  Marland Kitchen amLODipine (NORVASC) 10 MG tablet Take 1 tablet (10 mg total) by mouth daily.  . Ascorbic Acid (VITAMIN C PO) Take 1 tablet by mouth daily.   Marland Kitchen atorvastatin (LIPITOR) 80 MG tablet Take 1 tablet (80 mg total) by mouth daily.  . blood glucose meter kit and supplies Dispense based on patient and insurance preference. Use up to four times daily as directed. ( E11.9).  . cholecalciferol (VITAMIN D) 1000 UNITS tablet Take 2,000 Units by mouth daily.  . Fluticasone-Salmeterol (ADVAIR DISKUS) 250-50 MCG/DOSE AEPB INHALE 1 PUFF VIA INHALER TWO TIMES PER DAY, IN THE MORNING AND EVENING, APPROXIMATELY 12 HOURS APART RINSE mouth after each use  . glucose blood (ACCU-CHEK SMARTVIEW) test strip USE 1 STRIP DAILY  . Lancets (ACCU-CHEK SOFT TOUCH) lancets Check 1x per day Use as instructed E11.9  . losartan-hydrochlorothiazide (HYZAAR) 100-25 MG tablet Take 1 tablet by mouth daily.  . metFORMIN (GLUCOPHAGE) 1000 MG tablet Take 1 tablet (1,000 mg total) by mouth daily with breakfast.  . montelukast (SINGULAIR) 10 MG tablet Take 1 tablet (10 mg total) by mouth daily. At night   No Known Allergies Recent Results (from the past 2160 hour(s))  HM DIABETES EYE EXAM     Status: None   Collection Time: 01/17/19 12:00 AM  Result Value Ref Range   HM Diabetic Eye Exam No Retinopathy No Retinopathy    Comment: b/l cataracts Dr. B nice 01/17/19   Comprehensive metabolic panel     Status: Abnormal   Collection Time: 01/19/19  9:42 AM  Result Value Ref Range   Glucose 97 65 - 99 mg/dL   BUN 13 8 - 27 mg/dL   Creatinine, Ser 0.88 0.57 - 1.00 mg/dL   GFR calc non Af Amer 65 >59 mL/min/1.73   GFR calc Af Amer 75 >59 mL/min/1.73   BUN/Creatinine Ratio 15 12 - 28   Sodium 140 134 - 144 mmol/L   Potassium 4.1 3.5 - 5.2 mmol/L   Chloride 100 96 - 106 mmol/L   CO2 24 20 - 29 mmol/L   Calcium 10.4 (H) 8.7 - 10.3 mg/dL   Total Protein 7.2 6.0 - 8.5 g/dL   Albumin 4.4 3.7 - 4.7 g/dL   Globulin, Total 2.8 1.5 -  4.5 g/dL   Albumin/Globulin Ratio  1.6 1.2 - 2.2   Bilirubin Total 0.4 0.0 - 1.2 mg/dL   Alkaline Phosphatase 82 39 - 117 IU/L   AST 14 0 - 40 IU/L   ALT 16 0 - 32 IU/L  CBC w/Diff     Status: None   Collection Time: 01/19/19  9:42 AM  Result Value Ref Range   WBC 6.1 3.4 - 10.8 x10E3/uL   RBC 4.47 3.77 - 5.28 x10E6/uL   Hemoglobin 13.2 11.1 - 15.9 g/dL   Hematocrit 39.7 34.0 - 46.6 %   MCV 89 79 - 97 fL   MCH 29.5 26.6 - 33.0 pg   MCHC 33.2 31.5 - 35.7 g/dL   RDW 14.3 11.7 - 15.4 %   Platelets 312 150 - 450 x10E3/uL   Neutrophils 54 Not Estab. %   Lymphs 30 Not Estab. %   Monocytes 8 Not Estab. %   Eos 6 Not Estab. %   Basos 1 Not Estab. %   Neutrophils Absolute 3.3 1.4 - 7.0 x10E3/uL   Lymphocytes Absolute 1.8 0.7 - 3.1 x10E3/uL   Monocytes Absolute 0.5 0.1 - 0.9 x10E3/uL   EOS (ABSOLUTE) 0.4 0.0 - 0.4 x10E3/uL   Basophils Absolute 0.1 0.0 - 0.2 x10E3/uL   Immature Granulocytes 1 Not Estab. %   Immature Grans (Abs) 0.1 0.0 - 0.1 x10E3/uL  Lipid panel     Status: None   Collection Time: 01/19/19  9:42 AM  Result Value Ref Range   Cholesterol, Total 152 100 - 199 mg/dL   Triglycerides 81 0 - 149 mg/dL   HDL 40 >39 mg/dL   VLDL Cholesterol Cal 16 5 - 40 mg/dL   LDL Chol Calc (NIH) 96 0 - 99 mg/dL   Chol/HDL Ratio 3.8 0.0 - 4.4 ratio    Comment:                                   T. Chol/HDL Ratio                                             Men  Women                               1/2 Avg.Risk  3.4    3.3                                   Avg.Risk  5.0    4.4                                2X Avg.Risk  9.6    7.1                                3X Avg.Risk 23.4   11.0   TSH     Status: None   Collection Time: 01/19/19  9:42 AM  Result Value Ref Range   TSH 2.620 0.450 - 4.500 uIU/mL  Hemoglobin A1c     Status: Abnormal   Collection Time: 01/19/19  9:42 AM  Result Value Ref Range  Hgb A1c MFr Bld 6.6 (H) 4.8 - 5.6 %    Comment:          Prediabetes: 5.7 - 6.4           Diabetes: >6.4          Glycemic control for adults with diabetes: <7.0    Est. average glucose Bld gHb Est-mCnc 143 mg/dL  Microalbumin / creatinine urine ratio     Status: None   Collection Time: 01/19/19  9:42 AM  Result Value Ref Range   Creatinine, Urine 134.0 Not Estab. mg/dL   Microalbumin, Urine 11.5 Not Estab. ug/mL   Microalb/Creat Ratio 9 0 - 29 mg/g creat    Comment:                        Normal:                0 -  29                        Moderately increased: 30 - 300                        Severely increased:       >300               **Please note reference interval change**    Objective  Body mass index is 35.15 kg/m. Wt Readings from Last 3 Encounters:  01/23/19 177 lb (80.3 kg)  05/31/18 173 lb (78.5 kg)  10/13/17 173 lb 12.8 oz (78.8 kg)   Temp Readings from Last 3 Encounters:  01/23/19 (!) 97.3 F (36.3 C) (Oral)  05/31/18 98.1 F (36.7 C) (Oral)  10/13/17 98.3 F (36.8 C) (Oral)   BP Readings from Last 3 Encounters:  01/23/19 (!) 142/82  05/31/18 138/72  10/13/17 (!) 150/88   Pulse Readings from Last 3 Encounters:  01/23/19 (!) 105  05/31/18 96  10/13/17 84    Physical Exam Vitals signs and nursing note reviewed.  Constitutional:      Appearance: Normal appearance. She is well-developed and well-groomed. She is obese.  HENT:     Head: Normocephalic and atraumatic.     Comments: +mask on   Eyes:     Conjunctiva/sclera: Conjunctivae normal.     Pupils: Pupils are equal, round, and reactive to light.  Cardiovascular:     Rate and Rhythm: Normal rate and regular rhythm.     Heart sounds: Normal heart sounds. No murmur.  Pulmonary:     Effort: Pulmonary effort is normal.     Breath sounds: Normal breath sounds.  Skin:    General: Skin is warm and dry.  Neurological:     General: No focal deficit present.     Mental Status: She is alert and oriented to person, place, and time. Mental status is at baseline.     Gait: Gait normal.   Psychiatric:        Attention and Perception: Attention and perception normal.        Mood and Affect: Mood and affect normal.        Speech: Speech normal.        Behavior: Behavior normal. Behavior is cooperative.        Thought Content: Thought content normal.        Cognition and Memory: Cognition and memory normal.        Judgment: Judgment  normal.     Assessment  Plan  Essential hypertension - Plan: carvedilol (COREG) 3.125 MG tablet bid to other meds Monitor BP  Mid back pain - Plan: Ambulatory referral to Chiropractic [facility]  Uterine leiomyoma, unspecified location -wants to consider out ob/gyn  Liver cyst Fatty liver Atypical Hemangioma of intra-abdominal structure vs mets -f/u [facility] onc   Renal cell carcinoma of left kidney [facility]) F/u urology [facility] 12/2019 risk of mets <1% per Dr. 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 F/u [facility] onc consider SS tx sandostatin or lanreotide in future   Controlled type 2 diabetes mellitus without complication, without long-term current use of insulin ([place])  Cont meds Eye exam utd  Foot exam today 01/23/19  Urine 01/19/2019  HM Labs had 01/22/2019  Flu shot utd pna 23 given today  Consider Tdap in future  prevnar had 12/18/15 shingrix 2/2 doses utd  Consider pna 23  rec hep B vaccine in the past negative hep B ab qualitative 12/18/15   Of note elevated TSH 05/24/2018   elevated TSH Subclinical hypothyroidism-repeat TSH in the future  hcv neg 08/19/15   FIT test  [facility]  12/21/18 FIT could not be processed   Pap neg 07/2014 [facility] and out of age window pap  11/13/19mammogramnegative referred today    colonoscopy h/o polyps she did do cologuard 01/08/16 negative but with h/o polyps colonoscopy preferredshe is agreeable in the future seen [facility] in the past  12/2018 FIT test possibly   DEXA normal 07/2014  Former smoker quit 40 years ago low  risk and does not meet criteria see below   Provider: Dr. Olivia Mackie [facility]

## 2019-01-23 NOTE — Telephone Encounter (Signed)
Copied from [place] [id number]. Topic: General - Other >> Jan 23, 2019  1:41 PM Tracy Torres wrote: Reason for CRM: Pt wanted to inform Dr. Olivia Mackie that the appt with [facility] was for her husband, not her and it is actually an appt with the [facility] and not [facility]/  Pt also called about the medication / I advised her that her Carvedilol was sent

## 2019-01-25 DIAGNOSIS — D1800 Hemangioma unspecified site: Secondary | ICD-10-CM | POA: Insufficient documentation

## 2019-01-25 DIAGNOSIS — C642 Malignant neoplasm of left kidney, except renal pelvis: Secondary | ICD-10-CM | POA: Insufficient documentation

## 2019-01-25 NOTE — Addendum Note (Signed)
Addended by: Orland Mustard on: 01/25/2019 08:13 AM   Modules accepted: Orders

## 2019-02-19 ENCOUNTER — Ambulatory Visit
Admission: RE | Admit: 2019-02-19 | Discharge: 2019-02-19 | Disposition: A | Discharge status: Home / Self Care | Payer: Medicare Other | Source: Ambulatory Visit | Attending: Internal Medicine | Admitting: Internal Medicine

## 2019-02-19 ENCOUNTER — Telehealth: Payer: Self-pay | Admitting: *Deleted

## 2019-02-19 DIAGNOSIS — Z1231 Encounter for screening mammogram for malignant neoplasm of breast: Secondary | ICD-10-CM | POA: Diagnosis present

## 2019-02-19 NOTE — Telephone Encounter (Signed)
Copied from [place] [id number]. Topic: General - Other >> Feb 19, 2019  2:45 PM Leward Quan A wrote: Reason for CRM: A representative from [facility] called to say that patient called and cancelled her appointment stating that she was ok and did not need to be seen by them. Please advise

## 2019-02-20 NOTE — Telephone Encounter (Signed)
Give pt phone # to call back and reschedule appt if needed in the future [facility]   [place]

## 2019-02-21 NOTE — Telephone Encounter (Signed)
Patient given number

## 2019-03-15 NOTE — Telephone Encounter (Signed)
Patient is repeating cologuard tomorrow.

## 2019-03-15 NOTE — Telephone Encounter (Signed)
Copied from [place] [id number]. Topic: General - Other >> Mar 15, 2019 11:21 AM Keene Breath wrote: Reason for CRM: Patient requests a call from [facility] to let her know when was the date of her at home colon cancer screening test.  Please advise and call patient with the information.  CB# [phone]

## 2019-04-03 LAB — FECAL OCCULT BLOOD, IMMUNOCHEMICAL: IFOBT: NEGATIVE

## 2019-04-29 ENCOUNTER — Ambulatory Visit: Payer: Medicare Other

## 2019-05-04 ENCOUNTER — Other Ambulatory Visit: Payer: Self-pay | Admitting: Internal Medicine

## 2019-05-04 DIAGNOSIS — I10 Essential (primary) hypertension: Secondary | ICD-10-CM

## 2019-05-04 MED ORDER — CARVEDILOL 3.125 MG PO TABS: 3.1250 mg | ORAL_TABLET | Freq: Two times a day (BID) | ORAL | 3 refills | Status: DC

## 2019-05-14 ENCOUNTER — Encounter: Payer: Self-pay | Admitting: Internal Medicine

## 2019-05-15 ENCOUNTER — Telehealth: Payer: Self-pay | Admitting: Internal Medicine

## 2019-05-15 ENCOUNTER — Encounter: Payer: Self-pay | Admitting: Internal Medicine

## 2019-05-15 NOTE — Telephone Encounter (Signed)
Have you received it is not marked in chart nor copies.

## 2019-05-15 NOTE — Telephone Encounter (Signed)
Fit test negative 04/03/19   TMS

## 2019-05-15 NOTE — Telephone Encounter (Signed)
Pt has not heard back about results from Cologuard. Please call back

## 2019-05-16 NOTE — Telephone Encounter (Signed)
Patient has already recieved results

## 2019-06-08 ENCOUNTER — Ambulatory Visit: Payer: Medicare Other | Admitting: Internal Medicine

## 2019-06-27 ENCOUNTER — Other Ambulatory Visit: Payer: Self-pay

## 2019-06-29 ENCOUNTER — Telehealth: Payer: Self-pay | Admitting: Internal Medicine

## 2019-06-29 ENCOUNTER — Ambulatory Visit: Payer: Medicare PPO | Admitting: Internal Medicine

## 2019-06-29 NOTE — Telephone Encounter (Signed)
Pt no showed her 8:30 appointment with Dr Olivia Mackie.   Please reschedule the patient as I have canceled her appointment.

## 2019-06-29 NOTE — Telephone Encounter (Signed)
Noted, Dr Olivia Mackie informed verbally.

## 2019-06-29 NOTE — Telephone Encounter (Signed)
The patient has been rescheduled for 3.31.21 @ 11:30. The patient stated that she thought it was for next Friday .

## 2019-07-04 ENCOUNTER — Other Ambulatory Visit: Payer: Self-pay

## 2019-07-04 ENCOUNTER — Encounter: Payer: Self-pay | Admitting: Internal Medicine

## 2019-07-04 ENCOUNTER — Ambulatory Visit: Payer: Medicare PPO | Admitting: Internal Medicine

## 2019-07-04 VITALS — BP 140/84 | HR 75 | Temp 97.0°F | Ht 59.5 in | Wt 172.4 lb

## 2019-07-04 DIAGNOSIS — Z23 Encounter for immunization: Secondary | ICD-10-CM

## 2019-07-04 DIAGNOSIS — D219 Benign neoplasm of connective and other soft tissue, unspecified: Secondary | ICD-10-CM | POA: Diagnosis not present

## 2019-07-04 DIAGNOSIS — Z1389 Encounter for screening for other disorder: Secondary | ICD-10-CM

## 2019-07-04 DIAGNOSIS — D3502 Benign neoplasm of left adrenal gland: Secondary | ICD-10-CM | POA: Diagnosis not present

## 2019-07-04 DIAGNOSIS — C642 Malignant neoplasm of left kidney, except renal pelvis: Secondary | ICD-10-CM | POA: Diagnosis not present

## 2019-07-04 DIAGNOSIS — R946 Abnormal results of thyroid function studies: Secondary | ICD-10-CM | POA: Diagnosis not present

## 2019-07-04 DIAGNOSIS — E119 Type 2 diabetes mellitus without complications: Secondary | ICD-10-CM | POA: Diagnosis not present

## 2019-07-04 DIAGNOSIS — I10 Essential (primary) hypertension: Secondary | ICD-10-CM

## 2019-07-04 DIAGNOSIS — J4520 Mild intermittent asthma, uncomplicated: Secondary | ICD-10-CM

## 2019-07-04 DIAGNOSIS — D3A090 Benign carcinoid tumor of the bronchus and lung: Secondary | ICD-10-CM | POA: Diagnosis not present

## 2019-07-04 DIAGNOSIS — Z1231 Encounter for screening mammogram for malignant neoplasm of breast: Secondary | ICD-10-CM

## 2019-07-04 MED ORDER — TETANUS-DIPHTH-ACELL PERTUSSIS 5-2.5-18.5 LF-MCG/0.5 IM SUSP: 0.5000 mL | Freq: Once | INTRAMUSCULAR | 0 refills | Status: AC

## 2019-07-04 MED ORDER — CARVEDILOL 6.25 MG PO TABS: 6.2500 mg | ORAL_TABLET | Freq: Two times a day (BID) | ORAL | 3 refills | Status: DC

## 2019-07-04 MED ORDER — ATORVASTATIN CALCIUM 80 MG PO TABS: 80.0000 mg | ORAL_TABLET | Freq: Every day | ORAL | 3 refills | Status: DC

## 2019-07-04 MED ORDER — MONTELUKAST SODIUM 10 MG PO TABS: 10.0000 mg | ORAL_TABLET | Freq: Every day | ORAL | 3 refills | Status: DC

## 2019-07-04 MED ORDER — ALBUTEROL SULFATE HFA 108 (90 BASE) MCG/ACT IN AERS: 1.0000 | INHALATION_SPRAY | Freq: Four times a day (QID) | RESPIRATORY_TRACT | 11 refills | Status: DC | PRN

## 2019-07-04 MED ORDER — METFORMIN HCL 1000 MG PO TABS: 1000.0000 mg | ORAL_TABLET | Freq: Every day | ORAL | 3 refills | Status: DC

## 2019-07-04 MED ORDER — LOSARTAN POTASSIUM-HCTZ 100-25 MG PO TABS: 1.0000 | ORAL_TABLET | Freq: Every day | ORAL | 3 refills | Status: DC

## 2019-07-04 MED ORDER — AMLODIPINE BESYLATE 10 MG PO TABS: 10.0000 mg | ORAL_TABLET | Freq: Every day | ORAL | 3 refills | Status: DC

## 2019-07-04 MED ORDER — FLUTICASONE-SALMETEROL 250-50 MCG/DOSE IN AEPB: INHALATION_SPRAY | RESPIRATORY_TRACT | 11 refills | Status: DC

## 2019-07-04 MED ORDER — ACCU-CHEK SMARTVIEW VI STRP: ORAL_STRIP | 11 refills | Status: DC

## 2019-07-04 NOTE — Patient Instructions (Addendum)
Dr. Kerin Perna fibroid surgeon in [place] wise ginger/tumeric/curcumin  Lidocaine pain patch or salonpas pain patch   tdap vaccine recommended   [facility] in [place] [address]  platos closet in [place]   Increase coreg 6.25 mg 2x per day    Clotrimazole 1% 2x per day    07/31/2019 Ancillary Orders Lab    07/31/2019 Appointment Radiology Leamon Arnt Jacqulynn Cadet, MD  [facility] [address]  [place], [place] [address]  [phone]  [phone] (Fax)    07/31/2019 Appointment Radiology Leamon Arnt Jacqulynn Cadet, MD  [facility] [address]  [place], [place] [address]  [phone]  [phone] (Fax)    07/31/2019 Office Visit Oncology Demaris Callander, MD  [facility] [address]  [place], [place] [address]  [phone]  [phone] (Fax)    12/28/2019 Appointment Radiology Loura Pardon, MD  [address]  [place], [place] [address]  [phone]  [phone] (Fax)    12/28/2019 Office Visit Oncology Timoteo Gaul, MD  [facility] [place], [place] [address]  [phone]  [phone] (Fax)      Back Exercises The following exercises strengthen the muscles that help to support the trunk and back. They also help to keep the lower back flexible. Doing these exercises can help to prevent back pain or lessen existing pain.  If you have back pain or discomfort, try doing these exercises 2-3 times each day or as told by your health care provider.  As your pain improves, do them once each day, but increase the number of times that you repeat the steps for each exercise (do more repetitions).  To prevent the recurrence of back pain, continue to do these exercises once each day or as told by your health care provider. Do exercises exactly as told by your health care provider and adjust them as directed. It is normal to feel mild stretching,  pulling, tightness, or discomfort as you do these exercises, but you should stop right away if you feel sudden pain or your pain gets worse. Exercises Single knee to chest Repeat these steps 3-5 times for each leg: 1. Lie on your back on a firm bed or the floor with your legs extended. 2. Bring one knee to your chest. Your other leg should stay extended and in contact with the floor. 3. Hold your knee in place by grabbing your knee or thigh with both hands and hold. 4. Pull on your knee until you feel a gentle stretch in your lower back or buttocks. 5. Hold the stretch for 10-30 seconds. 6. Slowly release and straighten your leg. Pelvic tilt Repeat these steps 5-10 times: 1. Lie on your back on a firm bed or the floor with your legs extended. 2. Bend your knees so they are pointing toward the ceiling and your feet are flat on the floor. 3. Tighten your lower abdominal muscles to press your lower back against the floor. This motion will tilt your pelvis so your tailbone points up toward the ceiling instead of pointing to your feet or the floor. 4. With gentle tension and even breathing, hold this position for 5-10 seconds. Cat-cow Repeat these steps until your lower back becomes more flexible: 1. Get into a hands-and-knees position on a firm surface. Keep your hands under your shoulders, and keep your knees under your hips. You may place padding under your knees for comfort. 2. Let your head hang down toward your  chest. Contract your abdominal muscles and point your tailbone toward the floor so your lower back becomes rounded like the back of a cat. 3. Hold this position for 5 seconds. 4. Slowly lift your head, let your abdominal muscles relax and point your tailbone up toward the ceiling so your back forms a sagging arch like the back of a cow. 5. Hold this position for 5 seconds.  Press-ups Repeat these steps 5-10 times: 1. Lie on your abdomen (face-down) on the floor. 2. Place your palms  near your head, about shoulder-width apart. 3. Keeping your back as relaxed as possible and keeping your hips on the floor, slowly straighten your arms to raise the top half of your body and lift your shoulders. Do not use your back muscles to raise your upper torso. You may adjust the placement of your hands to make yourself more comfortable. 4. Hold this position for 5 seconds while you keep your back relaxed. 5. Slowly return to lying flat on the floor.  Bridges Repeat these steps 10 times: 1. Lie on your back on a firm surface. 2. Bend your knees so they are pointing toward the ceiling and your feet are flat on the floor. Your arms should be flat at your sides, next to your body. 3. Tighten your buttocks muscles and lift your buttocks off the floor until your waist is at almost the same height as your knees. You should feel the muscles working in your buttocks and the back of your thighs. If you do not feel these muscles, slide your feet 1-2 inches farther away from your buttocks. 4. Hold this position for 3-5 seconds. 5. Slowly lower your hips to the starting position, and allow your buttocks muscles to relax completely. If this exercise is too easy, try doing it with your arms crossed over your chest. Abdominal crunches Repeat these steps 5-10 times: 1. Lie on your back on a firm bed or the floor with your legs extended. 2. Bend your knees so they are pointing toward the ceiling and your feet are flat on the floor. 3. Cross your arms over your chest. 4. Tip your chin slightly toward your chest without bending your neck. 5. Tighten your abdominal muscles and slowly raise your trunk (torso) high enough to lift your shoulder blades a tiny bit off the floor. Avoid raising your torso higher than that because it can put too much stress on your low back and does not help to strengthen your abdominal muscles. 6. Slowly return to your starting position. Back lifts Repeat these steps 5-10  times: 1. Lie on your abdomen (face-down) with your arms at your sides, and rest your forehead on the floor. 2. Tighten the muscles in your legs and your buttocks. 3. Slowly lift your chest off the floor while you keep your hips pressed to the floor. Keep the back of your head in line with the curve in your back. Your eyes should be looking at the floor. 4. Hold this position for 3-5 seconds. 5. Slowly return to your starting position. Contact a health care provider if:  Your back pain or discomfort gets much worse when you do an exercise.  Your worsening back pain or discomfort does not lessen within 2 hours after you exercise. If you have any of these problems, stop doing these exercises right away. Do not do them again unless your health care provider says that you can. Get help right away if:  You develop sudden, severe back pain.  If this happens, stop doing the exercises right away. Do not do them again unless your health care provider says that you can. This information is not intended to replace advice given to you by your health care provider. Make sure you discuss any questions you have with your health care provider. Document Revised: 07/27/2018 Document Reviewed: 12/22/2017 Elsevier Patient Education  [place] vaccine  Tdap (Tetanus, Diphtheria, Pertussis) Vaccine: What You Need to Know 1. Why get vaccinated? Tdap vaccine can prevent tetanus, diphtheria, and pertussis. Diphtheria and pertussis spread from person to person. Tetanus enters the body through cuts or wounds.  TETANUS (T) causes painful stiffening of the muscles. Tetanus can lead to serious health problems, including being unable to open the mouth, having trouble swallowing and breathing, or death.  DIPHTHERIA (D) can lead to difficulty breathing, heart failure, paralysis, or death.  PERTUSSIS (aP), also known as "whooping cough," can cause uncontrollable, violent coughing which makes it hard to  breathe, eat, or drink. Pertussis can be extremely serious in babies and young children, causing pneumonia, convulsions, brain damage, or death. In teens and adults, it can cause weight loss, loss of bladder control, passing out, and rib fractures from severe coughing. 2. Tdap vaccine Tdap is only for children 7 years and older, adolescents, and adults.  Adolescents should receive a single dose of Tdap, preferably at age 65 or 3 years. Pregnant women should get a dose of Tdap during every pregnancy, to protect the newborn from pertussis. Infants are most at risk for severe, life-threatening complications from pertussis. Adults who have never received Tdap should get a dose of Tdap. Also, adults should receive a booster dose every 10 years, or earlier in the case of a severe and dirty wound or burn. Booster doses can be either Tdap or Td (a different vaccine that protects against tetanus and diphtheria but not pertussis). Tdap may be given at the same time as other vaccines. 3. Talk with your health care provider Tell your vaccine provider if the person getting the vaccine:  Has had an allergic reaction after a previous dose of any vaccine that protects against tetanus, diphtheria, or pertussis, or has any severe, life-threatening allergies.  Has had a coma, decreased level of consciousness, or prolonged seizures within 7 days after a previous dose of any pertussis vaccine (DTP, DTaP, or Tdap).  Has seizures or another nervous system problem.  Has ever had Guillain-Barr Syndrome (also called GBS).  Has had severe pain or swelling after a previous dose of any vaccine that protects against tetanus or diphtheria. In some cases, your health care provider may decide to postpone Tdap vaccination to a future visit.  People with minor illnesses, such as a cold, may be vaccinated. People who are moderately or severely ill should usually wait until they recover before getting Tdap vaccine.  Your health  care provider can give you more information. 4. Risks of a vaccine reaction  Pain, redness, or swelling where the shot was given, mild fever, headache, feeling tired, and nausea, vomiting, diarrhea, or stomachache sometimes happen after Tdap vaccine. People sometimes faint after medical procedures, including vaccination. Tell your provider if you feel dizzy or have vision changes or ringing in the ears.  As with any medicine, there is a very remote chance of a vaccine causing a severe allergic reaction, other serious injury, or death. 5. What if there is a serious problem? An allergic reaction could occur after the vaccinated person leaves the clinic.  If you see signs of a severe allergic reaction (hives, swelling of the face and throat, difficulty breathing, a fast heartbeat, dizziness, or weakness), call 9-1-1 and get the person to the nearest hospital. For other signs that concern you, call your health care provider.  Adverse reactions should be reported to the Vaccine Adverse Event Reporting System (VAERS). Your health care provider will usually file this report, or you can do it yourself. Visit the VAERS website at www.vaers.SamedayNews.es or call 262-858-9028. VAERS is only for reporting reactions, and VAERS staff do not give medical advice. 6. The National Vaccine Injury Compensation Program The [employer] Vaccine Injury Compensation Program (VICP) is a federal program that was created to compensate people who may have been injured by certain vaccines. Visit the VICP website at GoldCloset.com.ee or call 534-510-0920 to learn about the program and about filing a claim. There is a time limit to file a claim for compensation. 7. How can I learn more?  Ask your health care provider.  Call your local or state health department.  Contact the Centers for Disease Control and Prevention (CDC): ? Call 904-224-2860 (1-800-CDC-INFO) or ? Visit CDC's website at http://hunter.com/ Vaccine  Information Statement Tdap (Tetanus, Diphtheria, Pertussis) Vaccine (07/05/2018) This information is not intended to replace advice given to you by your health care provider. Make sure you discuss any questions you have with your health care provider. Document Revised: 07/14/2018 Document Reviewed: 07/17/2018 Elsevier Patient Education  [place].

## 2019-07-04 NOTE — Progress Notes (Signed)
Chief Complaint  Patient presents with  . Follow-up   F/u  1. HTN on coreg 3.125 mg bid, hyzaar 100-25 mg qd and norvasc 10 mg qd she does like to eat bacon and had 2 slices today 2. DM 2 on metformin 1000 01/19/2019 6.6  3. Fibroids x 2 she wants consult to see treatment options as abdomen is bloated  Reproductive: Enlarged uterus containing a 5.6 x 5.7 cm diameter LEFT uterine mass question leiomyoma image 62. Additional exophytic mass at fundus of uterus, partially calcified, 5.5 x 5.1 cm consistent with additional leiomyoma. Unremarkable ovaries. Prominent ovarian veins. 4. Mid back pain with arthritis noted on Xray given list of back stretches interested in chiropractor and not interested in back injections for now  5. Carcinoid lung PET/CT scan 01/30/2019 and further testing with negative activity of carcinoid and will f/u [facility] and urology 07/2019 and 12/2019  Left renal cell carcinoma on surveillance with [facility] and left adrenal adenoma    Review of Systems  Constitutional: Negative for weight loss.  HENT: Negative for hearing loss.   Eyes: Negative for blurred vision.  Respiratory: Negative for shortness of breath.   Cardiovascular: Negative for chest pain.  Gastrointestinal: Negative for abdominal pain.       +abdominal bloating   Musculoskeletal: Positive for back pain.  Skin: Negative for rash.  Neurological: Negative for headaches.  Psychiatric/Behavioral: Negative for depression.   Past Medical History:  Diagnosis Date  . Asthma   . Chicken pox   . CTS (carpal tunnel syndrome)   . Diabetes ([place])   . Fibroids   . History of colon polyps   . History of shingles   . Hypercalcemia    resolved after extra calcium and hctz stopped   . Hypercalcemia    07/2013 stopped calcium and normalized   . Hypertension   . Shingles   . Sinus problem   . Thyroid disease    abnormal thyroid tests TSH elevated 07/2014 Dr. Dionisio David  . Vitamin D deficiency    Past Surgical  History:  Procedure Laterality Date  . arm surgery     left arm age [age] y.o   . CATARACT EXTRACTION     right eye 09/08/18 Dr.Beavis  . EYE SURGERY    . TUBAL LIGATION     Family History  Problem Relation Age of Onset  . Diabetes Mother   . Heart disease Mother        MI  . Hypertension Mother   . Hyperlipidemia Mother   . Stroke Mother   . Alcohol abuse Father   . Cancer Father   . Hyperlipidemia Sister   . Hypertension Sister   . Alcohol abuse Brother   . Early death Brother   . Alcohol abuse Brother   . Cancer Brother   . Depression Brother   . Hyperlipidemia Brother   . Breast cancer Neg Hx    Social History   Socioeconomic History  . Marital status: Married    Spouse name: Not on file  . Number of children: Not on file  . Years of education: Not on file  . Highest education level: Not on file  Occupational History  . Not on file  Tobacco Use  . Smoking status: Former Research scientist (life sciences)  . Smokeless tobacco: Never Used  Substance and Sexual Activity  . Alcohol use: No  . Drug use: No  . Sexual activity: Yes    Comment: husband  Other Topics Concern  .  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 Difficulty of Paying Living Expenses:   Food Insecurity:   . Worried About Charity fundraiser in the Last Year:   . Arboriculturist in the Last Year:   Transportation Needs:   . Film/video editor (Medical):   Marland Kitchen Lack of Transportation (Non-Medical):   Physical Activity:   . Days of Exercise per Week:   . Minutes of Exercise per Session:   Stress:   . Feeling of Stress :   Social Connections:   . Frequency of Communication with Friends and Family:   . Frequency of Social Gatherings with Friends and Family:   . Attends Religious  Services:   . Active Member of Clubs or Organizations:   . Attends Archivist Meetings:   Marland Kitchen Marital Status:   Intimate Partner Violence:   . Fear of Current or Ex-Partner:   . Emotionally Abused:   Marland Kitchen Physically Abused:   . Sexually Abused:    Current Meds  Medication Sig  . albuterol (VENTOLIN HFA) 108 (90 Base) MCG/ACT inhaler Inhale 1-2 puffs into the lungs every 6 (six) hours as needed for wheezing or shortness of breath.  Marland Kitchen amLODipine (NORVASC) 10 MG tablet Take 1 tablet (10 mg total) by mouth daily.  . Ascorbic Acid (VITAMIN C PO) Take 1 tablet by mouth daily.   Marland Kitchen atorvastatin (LIPITOR) 80 MG tablet Take 1 tablet (80 mg total) by mouth daily.  . blood glucose meter kit and supplies Dispense based on patient and insurance preference. Use up to four times daily as directed. ( E11.9).  . cholecalciferol (VITAMIN D) 1000 UNITS tablet Take 2,000 Units by mouth daily.  . Fluticasone-Salmeterol (ADVAIR DISKUS) 250-50 MCG/DOSE AEPB INHALE 1 PUFF VIA INHALER TWO TIMES PER DAY, IN THE MORNING AND EVENING, APPROXIMATELY 12 HOURS APART RINSE mouth after each use  . glucose blood (ACCU-CHEK SMARTVIEW) test strip USE 1 STRIP DAILY  . Lancets (ACCU-CHEK SOFT TOUCH) lancets Check 1x per day Use as instructed E11.9  . losartan-hydrochlorothiazide (HYZAAR) 100-25 MG tablet Take 1 tablet by mouth daily.  . metFORMIN (GLUCOPHAGE) 1000 MG tablet Take 1 tablet (1,000 mg total) by mouth daily with breakfast.  . montelukast (SINGULAIR) 10 MG tablet Take 1 tablet (10 mg total) by mouth daily. At night  . [DISCONTINUED] albuterol (PROVENTIL HFA;VENTOLIN HFA) 108 (90 Base) MCG/ACT inhaler Inhale 1-2 puffs into the lungs every 6 (six) hours as needed for wheezing or shortness of breath.  . [DISCONTINUED] amLODipine (NORVASC) 10 MG tablet Take 1 tablet (10 mg total) by mouth daily.  . [DISCONTINUED] atorvastatin (LIPITOR) 80 MG tablet Take 1 tablet (80 mg total) by mouth daily.  . [DISCONTINUED]  Fluticasone-Salmeterol (ADVAIR DISKUS) 250-50 MCG/DOSE AEPB INHALE 1 PUFF VIA INHALER TWO TIMES PER DAY, IN THE MORNING AND EVENING, APPROXIMATELY 12 HOURS APART RINSE mouth after each use  . [DISCONTINUED] glucose blood (ACCU-CHEK SMARTVIEW) test strip USE 1 STRIP DAILY  . [DISCONTINUED] losartan-hydrochlorothiazide (HYZAAR) 100-25 MG tablet Take 1 tablet by mouth daily.  . [DISCONTINUED] metFORMIN (GLUCOPHAGE) 1000 MG tablet Take 1 tablet (1,000 mg total) by mouth daily with breakfast.  . [DISCONTINUED] montelukast (SINGULAIR)  10 MG tablet Take 1 tablet (10 mg total) by mouth daily. At night   No Known Allergies No results found for this or any previous visit (from the past 2160 hour(s)). Objective  Body mass index is 34.24 kg/m. Wt Readings from Last 3 Encounters:  07/04/19 172 lb 6.4 oz (78.2 kg)  01/23/19 177 lb (80.3 kg)  05/31/18 173 lb (78.5 kg)   Temp Readings from Last 3 Encounters:  07/04/19 (!) 97 F (36.1 C) (Temporal)  01/23/19 (!) 97.3 F (36.3 C) (Oral)  05/31/18 98.1 F (36.7 C) (Oral)   BP Readings from Last 3 Encounters:  07/04/19 140/84  01/23/19 (!) 142/82  05/31/18 138/72   Pulse Readings from Last 3 Encounters:  07/04/19 75  01/23/19 (!) 105  05/31/18 96    Physical Exam Vitals and nursing note reviewed.  Constitutional:      Appearance: Normal appearance. She is well-developed and well-groomed. She is obese.  HENT:     Head: Normocephalic and atraumatic.  Eyes:     Conjunctiva/sclera: Conjunctivae normal.     Pupils: Pupils are equal, round, and reactive to light.  Cardiovascular:     Rate and Rhythm: Normal rate and regular rhythm.     Heart sounds: Normal heart sounds. No murmur.  Pulmonary:     Effort: Pulmonary effort is normal.     Breath sounds: Normal breath sounds.  Abdominal:     General: Abdomen is flat. Bowel sounds are normal.     Tenderness: There is no abdominal tenderness.  Skin:    General: Skin is warm and dry.   Neurological:     General: No focal deficit present.     Mental Status: She is alert and oriented to person, place, and time. Mental status is at baseline.     Gait: Gait normal.  Psychiatric:        Attention and Perception: Attention and perception normal.        Mood and Affect: Mood and affect normal.        Speech: Speech normal.        Behavior: Behavior normal. Behavior is cooperative.        Thought Content: Thought content normal.        Cognition and Memory: Cognition and memory normal.        Judgment: Judgment normal.     Assessment  Plan  Essential hypertension - Plan: carvedilol (COREG) 6.25 MG tablet bid increased from 3.125 mg bid losartan-hydrochlorothiazide (HYZAAR) 100-25 MG tablet, amLODipine (NORVASC) 10 MG tablet   Controlled type 2 diabetes mellitus without complication, without long-term current use of insulin (HCC) - Plan: metFORMIN (GLUCOPHAGE) 1000 MG tablet, glucose blood (ACCU-CHEK SMARTVIEW) test strip, atorvastatin (LIPITOR) 80 MG tablet -pt will come in 4/7 for instruction on how to use her home meter   Mild intermittent asthma without complication - Plan: montelukast (SINGULAIR) 10 MG tablet, Fluticasone-Salmeterol (ADVAIR DISKUS) 250-50 MCG/DOSE AEPB, albuterol (VENTOLIN HFA) 108 (90 Base) MCG/ACT inhaler  Fibroids Consult with Dr. 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 [facility]) Adrenal adenoma, left  F/u [facility], urology 07/2019, 12/2019  [facility] had 01/22/2019 Flu shot not had 2020 pna 23 utd  Consider Tdap in future sent to pharmacy  prevnar had 12/18/15 shingrix 2/2 doses utd  Consider pna 23  rec hep B vaccine in the past negative hep B ab qualitative 12/18/15   Of note elevated TSH 05/24/2018 elevated  TSH Subclinical hypothyroidism-repeat TSH in the future hcv neg 08/19/15   Pap neg 07/2014 [facility] and out of age window pap  11/16/20mammogramnegativereferred  today  colonoscopy h/o polyps she did do cologuard 01/08/16 negative but with h/o polyps colonoscopy preferredshe is agreeable in the future seen [facility] in the past FIT test 04/03/2019 negative   DEXA normal 07/2014  Former smoker quit 40 years ago low risk and does not meet criteria see below   Provider: Dr. Olivia Mackie McLean-Scocuzza-Internal Medicine

## 2019-07-11 ENCOUNTER — Ambulatory Visit: Payer: Medicare PPO

## 2019-07-16 ENCOUNTER — Telehealth: Payer: Self-pay | Admitting: Internal Medicine

## 2019-07-16 NOTE — Telephone Encounter (Signed)
Dr. Darreld Mclean in [place] does not see women >48 for surgery for fibroids  Does she want a referral elsewhere to ob/gyn ?  [facility]e [facility]? Or [facility]?   Let me know

## 2019-07-16 NOTE — Telephone Encounter (Signed)
Left message to return call 

## 2019-07-19 NOTE — Telephone Encounter (Signed)
Patient informed and verbalized understanding.  She would like a referral to [facility]

## 2019-07-20 ENCOUNTER — Other Ambulatory Visit: Payer: Self-pay | Admitting: Internal Medicine

## 2019-07-20 DIAGNOSIS — D219 Benign neoplasm of connective and other soft tissue, unspecified: Secondary | ICD-10-CM

## 2019-07-20 NOTE — Telephone Encounter (Signed)
Sent to [facility] to go with note 07/04/19 fibroids  Dr. Marjory Lies   Thanks tMS

## 2019-08-31 ENCOUNTER — Encounter: Payer: Self-pay | Admitting: Internal Medicine

## 2019-10-11 DIAGNOSIS — D250 Submucous leiomyoma of uterus: Secondary | ICD-10-CM | POA: Diagnosis not present

## 2019-10-11 DIAGNOSIS — Z124 Encounter for screening for malignant neoplasm of cervix: Secondary | ICD-10-CM | POA: Diagnosis not present

## 2019-10-11 DIAGNOSIS — D252 Subserosal leiomyoma of uterus: Secondary | ICD-10-CM | POA: Diagnosis not present

## 2019-10-11 DIAGNOSIS — D251 Intramural leiomyoma of uterus: Secondary | ICD-10-CM | POA: Diagnosis not present

## 2019-10-31 ENCOUNTER — Other Ambulatory Visit: Payer: Self-pay | Admitting: Internal Medicine

## 2019-10-31 DIAGNOSIS — E119 Type 2 diabetes mellitus without complications: Secondary | ICD-10-CM

## 2019-10-31 MED ORDER — ATORVASTATIN CALCIUM 80 MG PO TABS: 80.0000 mg | ORAL_TABLET | Freq: Every day | ORAL | 3 refills | Status: DC

## 2019-11-05 DIAGNOSIS — K769 Liver disease, unspecified: Secondary | ICD-10-CM | POA: Diagnosis not present

## 2019-11-05 DIAGNOSIS — R918 Other nonspecific abnormal finding of lung field: Secondary | ICD-10-CM | POA: Diagnosis not present

## 2019-11-05 DIAGNOSIS — C7A090 Malignant carcinoid tumor of the bronchus and lung: Secondary | ICD-10-CM | POA: Diagnosis not present

## 2019-11-05 DIAGNOSIS — Z87891 Personal history of nicotine dependence: Secondary | ICD-10-CM | POA: Diagnosis not present

## 2019-11-05 DIAGNOSIS — C7A8 Other malignant neuroendocrine tumors: Secondary | ICD-10-CM | POA: Diagnosis not present

## 2019-11-05 DIAGNOSIS — N2889 Other specified disorders of kidney and ureter: Secondary | ICD-10-CM | POA: Diagnosis not present

## 2019-11-14 ENCOUNTER — Encounter: Payer: Self-pay | Admitting: Internal Medicine

## 2019-11-14 ENCOUNTER — Other Ambulatory Visit: Payer: Self-pay

## 2019-11-14 ENCOUNTER — Telehealth (INDEPENDENT_AMBULATORY_CARE_PROVIDER_SITE_OTHER): Payer: Medicare PPO | Admitting: Internal Medicine

## 2019-11-14 VITALS — BP 107/64 | HR 84 | Temp 97.8°F | Ht 59.5 in | Wt 166.0 lb

## 2019-11-14 DIAGNOSIS — E669 Obesity, unspecified: Secondary | ICD-10-CM

## 2019-11-14 DIAGNOSIS — R946 Abnormal results of thyroid function studies: Secondary | ICD-10-CM

## 2019-11-14 DIAGNOSIS — Z1389 Encounter for screening for other disorder: Secondary | ICD-10-CM

## 2019-11-14 DIAGNOSIS — C642 Malignant neoplasm of left kidney, except renal pelvis: Secondary | ICD-10-CM

## 2019-11-14 DIAGNOSIS — R05 Cough: Secondary | ICD-10-CM

## 2019-11-14 DIAGNOSIS — E559 Vitamin D deficiency, unspecified: Secondary | ICD-10-CM | POA: Diagnosis not present

## 2019-11-14 DIAGNOSIS — R0989 Other specified symptoms and signs involving the circulatory and respiratory systems: Secondary | ICD-10-CM

## 2019-11-14 DIAGNOSIS — E119 Type 2 diabetes mellitus without complications: Secondary | ICD-10-CM

## 2019-11-14 DIAGNOSIS — E1159 Type 2 diabetes mellitus with other circulatory complications: Secondary | ICD-10-CM

## 2019-11-14 DIAGNOSIS — J984 Other disorders of lung: Secondary | ICD-10-CM | POA: Diagnosis not present

## 2019-11-14 DIAGNOSIS — E1169 Type 2 diabetes mellitus with other specified complication: Secondary | ICD-10-CM

## 2019-11-14 DIAGNOSIS — I10 Essential (primary) hypertension: Secondary | ICD-10-CM

## 2019-11-14 DIAGNOSIS — I152 Hypertension secondary to endocrine disorders: Secondary | ICD-10-CM

## 2019-11-14 DIAGNOSIS — D3A8 Other benign neuroendocrine tumors: Secondary | ICD-10-CM | POA: Diagnosis not present

## 2019-11-14 DIAGNOSIS — R918 Other nonspecific abnormal finding of lung field: Secondary | ICD-10-CM

## 2019-11-14 DIAGNOSIS — R059 Cough, unspecified: Secondary | ICD-10-CM

## 2019-11-14 MED ORDER — ACCU-CHEK SOFT TOUCH LANCETS MISC: 12 refills | Status: AC

## 2019-11-14 MED ORDER — ATORVASTATIN CALCIUM 80 MG PO TABS: 80.0000 mg | ORAL_TABLET | Freq: Every day | ORAL | 3 refills | Status: DC

## 2019-11-14 MED ORDER — HYDROCOD POLST-CPM POLST ER 10-8 MG/5ML PO SUER: 5.0000 mL | Freq: Every evening | ORAL | 0 refills | Status: DC | PRN

## 2019-11-14 NOTE — Progress Notes (Signed)
telephone Note  I connected with Tracy Torres  on 11/14/19 at  4:25 PM EDT telephone and verified that I am speaking with the correct person using two identifiers.  Location patient: home Location provider:work or home office Persons participating in the virtual visit: patient, provider, pts husband and grandson  I discussed the limitations of evaluation and management by telemedicine and the availability of in person appointments. The patient expressed understanding and agreed to proceed.   HPI:  F/u  1. Dry cough (h/o asthma, copd +emphysema) x 4-5 days with clear phelgm, sneezing with clear nasal drainage. She is coughing so hard to her abdomen and chest are sore. Denies pnd, sick contacts. She did go to the casino in [place] 10/31/19 but wore a mask there and in the casino and back. Her husband is not sick her grandchild young toddler has also been sneezing. She denies wheezing, sob. Fever, bodyaches, chills.  She has tried Mucinex blue label and this does help with cough and she has albuterol and advair inhalers   2. RCC left kidney stable in size h/o was to speak to [facility] urology to see what they wanted to do about the [place] imaging 11/05/19 and f/u CT chest and MRI ab/pelvis in 6 months with [facility]    ROS: See pertinent positives and negatives per HPI.  Past Medical History:  Diagnosis Date  . Asthma   . Chicken pox   . CTS (carpal tunnel syndrome)   . Diabetes ([place])   . Fibroids   . History of colon polyps   . History of shingles   . Hypercalcemia    resolved after extra calcium and hctz stopped   . Hypercalcemia    07/2013 stopped calcium and normalized   . Hypertension   . Shingles   . Sinus problem   . Thyroid disease    abnormal thyroid tests TSH elevated 07/2014 Dr. Dionisio David  . Vitamin D deficiency     Past Surgical History:  Procedure Laterality Date  . arm surgery     left arm age [age] y.o   . CATARACT EXTRACTION     right eye 09/08/18 Dr.Beavis  . EYE  SURGERY    . TUBAL LIGATION      Family History  Problem Relation Age of Onset  . Diabetes Mother   . Heart disease Mother        MI  . Hypertension Mother   . Hyperlipidemia Mother   . Stroke Mother   . Alcohol abuse Father   . Cancer Father   . Hyperlipidemia Sister   . Hypertension Sister   . Alcohol abuse Brother   . Early death Brother   . Alcohol abuse Brother   . Cancer Brother   . Depression Brother   . Hyperlipidemia Brother   . Breast cancer Neg Hx     SOCIAL HX: married 1 toddler grandson   Current Outpatient Medications:  .  albuterol (VENTOLIN HFA) 108 (90 Base) MCG/ACT inhaler, Inhale 1-2 puffs into the lungs every 6 (six) hours as needed for wheezing or shortness of breath., Disp: 18 g, Rfl: 11 .  amLODipine (NORVASC) 10 MG tablet, Take 1 tablet (10 mg total) by mouth daily., Disp: 90 tablet, Rfl: 3 .  Ascorbic Acid (VITAMIN C PO), Take 1 tablet by mouth daily. , Disp: , Rfl:  .  atorvastatin (LIPITOR) 80 MG tablet, Take 1 tablet (80 mg total) by mouth daily., Disp: 90 tablet, Rfl: 3 .  blood glucose  meter kit and supplies, Dispense based on patient and insurance preference. Use up to four times daily as directed. ( E11.9)., Disp: 1 each, Rfl: 0 .  carvedilol (COREG) 6.25 MG tablet, Take 1 tablet (6.25 mg total) by mouth 2 (two) times daily with a meal., Disp: 180 tablet, Rfl: 3 .  cholecalciferol (VITAMIN D) 1000 UNITS tablet, Take 2,000 Units by mouth daily., Disp: , Rfl:  .  Fluticasone-Salmeterol (ADVAIR DISKUS) 250-50 MCG/DOSE AEPB, INHALE 1 PUFF VIA INHALER TWO TIMES PER DAY, IN THE MORNING AND EVENING, APPROXIMATELY 12 HOURS APART RINSE mouth after each use, Disp: 60 each, Rfl: 11 .  glucose blood (ACCU-CHEK SMARTVIEW) test strip, USE 1 STRIP DAILY, Disp: 100 each, Rfl: 11 .  Lancets (ACCU-CHEK SOFT TOUCH) lancets, FILL FASTCLIX LANCETS 102s Check 1x per day Use as instructed E11.9, Disp: 100 each, Rfl: 12 .  losartan-hydrochlorothiazide (HYZAAR) 100-25 MG  tablet, Take 1 tablet by mouth daily., Disp: 90 tablet, Rfl: 3 .  metFORMIN (GLUCOPHAGE) 1000 MG tablet, Take 1 tablet (1,000 mg total) by mouth daily with breakfast., Disp: 90 tablet, Rfl: 3 .  montelukast (SINGULAIR) 10 MG tablet, Take 1 tablet (10 mg total) by mouth daily. At night, Disp: 90 tablet, Rfl: 3 .  chlorpheniramine-HYDROcodone (TUSSIONEX PENNKINETIC ER) 10-8 MG/5ML SUER, Take 5 mLs by mouth at bedtime as needed for cough., Disp: 115 mL, Rfl: 0  EXAM:  VITALS per patient if applicable:  GENERAL: alert, oriented, appears well and in no acute distress  LUNGS: +some cough over the phone, no signs of respiratory distress, breathing rate appears normal, no obvious gross SOB, gasping or wheezing  CV: no obvious cyanosis  MS: moves all visible extremities without noticeable abnormality  PSYCH/NEURO: pleasant and cooperative, no obvious depression or anxiety, speech and thought processing grossly intact  ASSESSMENT AND PLAN:  Discussed the following assessment and plan:  Cough with h/o asthma CT chest prior +emphysema CT 11/05/19 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 - Plan: chlorpheniramine-HYDROcodone (TUSSIONEX PENNKINETIC ER) 10-8 MG/5ML SUER Albuterol, advair  Ns,flonase for sneezing and otc AH  Consider covid 19 testing and Abx, steroids if not better by Friday today I do not feel like this is asthma or copd exacerbation severe enough to warrant Abx/steroids  Controlled type 2 diabetes mellitus without complication, without long-term current use of insulin ([place]) - Plan: Lancets (ACCU-CHEK SOFT TOUCH) lancets, atorvastatin (LIPITOR) 80 MG tablet, Hemoglobin A1c, Lipid panel, Urinalysis, Routine w reflex microscopic Cont meds   renal cell carcinoma of left kidney ([place]) F/u [facility] urology   Abnormal thyroid exam - Plan: TSH  Vitamin D deficiency - Plan: Vitamin D (25 hydroxy)  Hypertension associated with diabetes ([place]) - Plan:  Hemoglobin A1c, Lipid panel, Urinalysis, Routine w reflex microscopic Obesity, diabetes, and hypertension syndrome ([place]) - Plan: Hemoglobin A1c, Lipid panel, Urinalysis, Routine w reflex microscopic Cont meds  HM Flu shot not had 2020 pna 23 utd  [place] 2/2 utd Consider Tdap in futuresent to pharmacy  prevnar had 12/18/15, pna 23 utd  shingrix 2/2 doses utd  rec hep B vaccine in the past negative hep B ab qualitative 12/18/15   Of note elevated TSH 05/24/2018 elevated TSH Subclinical hypothyroidism-repeat TSH in the future hcv neg 08/19/15  Pap neg 07/2014 [facility] and out of age window pap  11/16/20mammogramnegativereferred today  colonoscopy h/o polyps she did do cologuard 01/08/16 negative but with h/o polyps colonoscopy preferredshe is agreeable in the future seen [facility] in the past FIT  test 04/03/2019 negative   DEXA normal 07/2014  Former smoker quit 40 years ago low risk and does not meet criteria see below    -we discussed possible serious and likely etiologies, options for evaluation and workup, limitations of telemedicine visit vs in person visit, treatment, treatment risks and precautions. Pt prefers to treat via telemedicine empirically rather then risking or undertaking an in person visit at this moment. Patient agrees to seek prompt in person care if worsening, new symptoms arise, or if is not improving with treatment.   I discussed the assessment and treatment plan with the patient. The patient was provided an opportunity to ask questions and all were answered. The patient agreed with the plan and demonstrated an understanding of the instructions.   The patient was advised to call back or seek an in-person evaluation if the symptoms worsen or if the condition fails to improve as anticipated.  Time spent 20 min Delorise Jackson, MD

## 2019-11-15 ENCOUNTER — Telehealth: Payer: Self-pay | Admitting: Internal Medicine

## 2019-11-15 NOTE — Telephone Encounter (Signed)
Lm to let pt know 12/12/2019 appointment cancelled since pt was seen on 11/14/2019. Ask pt to call and make a 59m follow up.

## 2019-12-12 ENCOUNTER — Ambulatory Visit: Payer: Medicare PPO | Admitting: Internal Medicine

## 2020-01-24 ENCOUNTER — Ambulatory Visit: Payer: Medicare Other

## 2020-02-20 ENCOUNTER — Other Ambulatory Visit: Payer: Self-pay

## 2020-02-20 ENCOUNTER — Encounter: Payer: Self-pay | Admitting: Internal Medicine

## 2020-02-20 ENCOUNTER — Ambulatory Visit: Payer: Medicare PPO | Admitting: Internal Medicine

## 2020-02-20 VITALS — BP 146/90 | HR 75 | Temp 97.6°F | Ht 59.5 in | Wt 157.4 lb

## 2020-02-20 DIAGNOSIS — I152 Hypertension secondary to endocrine disorders: Secondary | ICD-10-CM

## 2020-02-20 DIAGNOSIS — B351 Tinea unguium: Secondary | ICD-10-CM

## 2020-02-20 DIAGNOSIS — I10 Essential (primary) hypertension: Secondary | ICD-10-CM | POA: Diagnosis not present

## 2020-02-20 DIAGNOSIS — E669 Obesity, unspecified: Secondary | ICD-10-CM | POA: Diagnosis not present

## 2020-02-20 DIAGNOSIS — D3A090 Benign carcinoid tumor of the bronchus and lung: Secondary | ICD-10-CM

## 2020-02-20 DIAGNOSIS — E119 Type 2 diabetes mellitus without complications: Secondary | ICD-10-CM | POA: Diagnosis not present

## 2020-02-20 DIAGNOSIS — E1159 Type 2 diabetes mellitus with other circulatory complications: Secondary | ICD-10-CM | POA: Diagnosis not present

## 2020-02-20 DIAGNOSIS — Z1329 Encounter for screening for other suspected endocrine disorder: Secondary | ICD-10-CM

## 2020-02-20 DIAGNOSIS — C642 Malignant neoplasm of left kidney, except renal pelvis: Secondary | ICD-10-CM | POA: Diagnosis not present

## 2020-02-20 DIAGNOSIS — J984 Other disorders of lung: Secondary | ICD-10-CM | POA: Diagnosis not present

## 2020-02-20 DIAGNOSIS — D49512 Neoplasm of unspecified behavior of left kidney: Secondary | ICD-10-CM | POA: Diagnosis not present

## 2020-02-20 DIAGNOSIS — D3A8 Other benign neuroendocrine tumors: Secondary | ICD-10-CM

## 2020-02-20 DIAGNOSIS — R946 Abnormal results of thyroid function studies: Secondary | ICD-10-CM

## 2020-02-20 DIAGNOSIS — E1169 Type 2 diabetes mellitus with other specified complication: Secondary | ICD-10-CM

## 2020-02-20 MED ORDER — TERBINAFINE HCL 250 MG PO TABS: 250.0000 mg | ORAL_TABLET | Freq: Every day | ORAL | 0 refills | Status: DC

## 2020-02-20 MED ORDER — UREA 47 % EX CREA: 1.0000 "application " | TOPICAL_CREAM | Freq: Every day | CUTANEOUS | 2 refills | Status: DC

## 2020-02-20 NOTE — Patient Instructions (Addendum)
[place] healthy feet daily Vinegar soaks 1 cup to 8 cups of warm water weekly  Please do cologuard on Monday   Senna (laxative) and colace (stool softner)   Consider seeing foot doctor about spot 05/2020 [facility] clinic   Use lamsil cream to bottom of feet over the counter    Urea on nail and topical antifungal nail cream and cover at night with guaze/bandaids/clear wrapping   Call Dr. Jacinto Reap [place]   Hypokalemia Hypokalemia means that the amount of potassium in the blood is lower than normal. Potassium is a chemical (electrolyte) that helps regulate the amount of fluid in the body. It also stimulates muscle tightening (contraction) and helps nerves work properly. Normally, most of the body's potassium is inside cells, and only a very small amount is in the blood. Because the amount in the blood is so small, minor changes to potassium levels in the blood can be life-threatening. What are the causes? This condition may be caused by:  Antibiotic medicine.  Diarrhea or vomiting. Taking too much of a medicine that helps you have a bowel movement (laxative) can cause diarrhea and lead to hypokalemia.  Chronic kidney disease (CKD).  Medicines that help the body get rid of excess fluid (diuretics).  Eating disorders, such as bulimia.  Low magnesium levels in the body.  Sweating a lot. What are the signs or symptoms? Symptoms of this condition include:  Weakness.  Constipation.  Fatigue.  Muscle cramps.  Mental confusion.  Skipped heartbeats or irregular heartbeat (palpitations).  Tingling or numbness. How is this diagnosed? This condition is diagnosed with a blood test. How is this treated? This condition may be treated by:  Taking potassium supplements by mouth.  Adjusting the medicines that you take.  Eating more foods that contain a lot of potassium. If your potassium level is very low, you may need to get potassium through an IV and be monitored in the  hospital. Follow these instructions at home:   Take over-the-counter and prescription medicines only as told by your health care provider. This includes vitamins and supplements.  Eat a healthy diet. A healthy diet includes fresh fruits and vegetables, whole grains, healthy fats, and lean proteins.  If instructed, eat more foods that contain a lot of potassium. This includes: ? Nuts, such as peanuts and pistachios. ? Seeds, such as sunflower seeds and pumpkin seeds. ? Peas, lentils, and lima beans. ? Whole grain and bran cereals and breads. ? Fresh fruits and vegetables, such as apricots, avocado, bananas, cantaloupe, kiwi, oranges, tomatoes, asparagus, and potatoes. ? Orange juice. ? Tomato juice. ? Red meats. ? Yogurt.  Keep all follow-up visits as told by your health care provider. This is important. Contact a health care provider if you:  Have weakness that gets worse.  Feel your heart pounding or racing.  Vomit.  Have diarrhea.  Have diabetes (diabetes mellitus) and you have trouble keeping your blood sugar (glucose) in your target range. Get help right away if you:  Have chest pain.  Have shortness of breath.  Have vomiting or diarrhea that lasts for more than 2 days.  Faint. Summary  Hypokalemia means that the amount of potassium in the blood is lower than normal.  This condition is diagnosed with a blood test.  Hypokalemia may be treated by taking potassium supplements, adjusting the medicines that you take, or eating more foods that are high in potassium.  If your potassium level is very low, you may need to get potassium through  an IV and be monitored in the hospital. This information is not intended to replace advice given to you by your health care provider. Make sure you discuss any questions you have with your health care provider. Document Revised: 11/02/2017 Document Reviewed: 11/02/2017 Elsevier Patient Education  [place].

## 2020-02-20 NOTE — Progress Notes (Signed)
Chief Complaint  Patient presents with  . Follow-up   F/u  1. HTN with DM2 elevated today on norvasc 10 mg qd, coreg 6.25 mg bid, hyzaar 100-25 mg qd and metformin 1000 mg bid 2. Toenail fungus all 5 toenails nail culture +saprophytes tx best with lamisil and urea>40% also has right foot lesion on sole of foot and clavi agreeable to see podiatry 05/2020 will call back [facility] podiatry referral  3. Left RCC and neuroendocrine tumor pulm nodules lungs f/u [facility] monitoring and 05/2020 will have CT chest and MRI ab/pelvis    Review of Systems  Constitutional: Negative for weight loss.  HENT: Negative for hearing loss.   Eyes: Negative for blurred vision.  Respiratory: Negative for shortness of breath.   Cardiovascular: Negative for chest pain.  Gastrointestinal: Negative for abdominal pain.  Musculoskeletal: Negative for falls and joint pain.  Skin: Negative for rash.  Psychiatric/Behavioral: Negative for depression.   Past Medical History:  Diagnosis Date  . Asthma   . Chicken pox   . CTS (carpal tunnel syndrome)   . Diabetes ([place])   . Fibroids   . History of colon polyps   . History of shingles   . Hypercalcemia    resolved after extra calcium and hctz stopped   . Hypercalcemia    07/2013 stopped calcium and normalized   . Hypertension   . Shingles   . Sinus problem   . Thyroid disease    abnormal thyroid tests TSH elevated 07/2014 Dr. Dionisio David  . Vitamin D deficiency    Past Surgical History:  Procedure Laterality Date  . arm surgery     left arm age [age] y.o   . CATARACT EXTRACTION     right eye 09/08/18 Dr.Beavis  . EYE SURGERY    . TUBAL LIGATION     Family History  Problem Relation Age of Onset  . Diabetes Mother   . Heart disease Mother        MI  . Hypertension Mother   . Hyperlipidemia Mother   . Stroke Mother   . Alcohol abuse Father   . Cancer Father   . Hyperlipidemia Sister   . Hypertension Sister   . Alcohol abuse Brother   . Early death Brother   .  Alcohol abuse Brother   . Cancer Brother   . Depression Brother   . Hyperlipidemia Brother   . Breast cancer Neg Hx    Social History   Socioeconomic History  . Marital status: Married    Spouse name: Not on file  . Number of children: Not on file  . Years of education: Not on file  . Highest education level: Not on file  Occupational History  . Not on file  Tobacco Use  . Smoking status: Former Research scientist (life sciences)  . Smokeless tobacco: Never Used  Vaping Use  . Vaping Use: Never used  Substance and Sexual Activity  . Alcohol use: No  . Drug use: No  . Sexual activity: Yes    Comment: husband  Other Topics Concern  . Not on file  Social History Narrative   1 year of college retired    Married    1 daughter summer in [place] aged 69 as of 10/2018 and 1 son in [place]    Had grandson Vivia Budge 05/23/19 by daughter Summer   Social Determinants of Health   Financial Resource Strain:   . Difficulty of Paying Living Expenses: Not on file  Food Insecurity:   .  Worried About Charity fundraiser in the Last Year: Not on file  . Ran Out of Food in the Last Year: Not on file  Transportation Needs:   . Lack of Transportation (Medical): Not on file  . Lack of Transportation (Non-Medical): Not on file  Physical Activity:   . Days of Exercise per Week: Not on file  . Minutes of Exercise per Session: Not on file  Stress:   . Feeling of Stress : Not on file  Social Connections:   . Frequency of Communication with Friends and Family: Not on file  . Frequency of Social Gatherings with Friends and Family: Not on file  . Attends Religious Services: Not on file  . Active Member of Clubs or Organizations: Not on file  . Attends Archivist Meetings: Not on file  . Marital Status: Not on file  Intimate Partner Violence:   . Fear of Current or Ex-Partner: Not on file  . Emotionally Abused: Not on file  . Physically Abused: Not on file  . Sexually Abused: Not on file   Current  Meds  Medication Sig  . albuterol (VENTOLIN HFA) 108 (90 Base) MCG/ACT inhaler Inhale 1-2 puffs into the lungs every 6 (six) hours as needed for wheezing or shortness of breath.  Marland Kitchen amLODipine (NORVASC) 10 MG tablet Take 1 tablet (10 mg total) by mouth daily.  . Ascorbic Acid (VITAMIN C PO) Take 1 tablet by mouth daily.   Marland Kitchen atorvastatin (LIPITOR) 80 MG tablet Take 1 tablet (80 mg total) by mouth daily.  . blood glucose meter kit and supplies Dispense based on patient and insurance preference. Use up to four times daily as directed. ( E11.9).  . carvedilol (COREG) 6.25 MG tablet Take 1 tablet (6.25 mg total) by mouth 2 (two) times daily with a meal.  . chlorpheniramine-HYDROcodone (TUSSIONEX PENNKINETIC ER) 10-8 MG/5ML SUER Take 5 mLs by mouth at bedtime as needed for cough.  . cholecalciferol (VITAMIN D) 1000 UNITS tablet Take 2,000 Units by mouth daily.  . Fluticasone-Salmeterol (ADVAIR DISKUS) 250-50 MCG/DOSE AEPB INHALE 1 PUFF VIA INHALER TWO TIMES PER DAY, IN THE MORNING AND EVENING, APPROXIMATELY 12 HOURS APART RINSE mouth after each use  . glucose blood (ACCU-CHEK SMARTVIEW) test strip USE 1 STRIP DAILY  . Lancets (ACCU-CHEK SOFT TOUCH) lancets FILL FASTCLIX LANCETS 102s Check 1x per day Use as instructed E11.9  . losartan-hydrochlorothiazide (HYZAAR) 100-25 MG tablet Take 1 tablet by mouth daily.  . metFORMIN (GLUCOPHAGE) 1000 MG tablet Take 1 tablet (1,000 mg total) by mouth daily with breakfast.  . montelukast (SINGULAIR) 10 MG tablet Take 1 tablet (10 mg total) by mouth daily. At night   No Known Allergies No results found for this or any previous visit (from the past 2160 hour(s)). Objective  Body mass index is 31.26 kg/m. Wt Readings from Last 3 Encounters:  02/20/20 157 lb 6.4 oz (71.4 kg)  11/14/19 166 lb (75.3 kg)  07/04/19 172 lb 6.4 oz (78.2 kg)   Temp Readings from Last 3 Encounters:  02/20/20 97.6 F (36.4 C) (Oral)  11/14/19 97.8 F (36.6 C) (Temporal)  07/04/19 (!)  97 F (36.1 C) (Temporal)   BP Readings from Last 3 Encounters:  02/20/20 (!) 146/90  11/14/19 107/64  07/04/19 140/84   Pulse Readings from Last 3 Encounters:  02/20/20 75  11/14/19 84  07/04/19 75    Physical Exam Vitals and nursing note reviewed.  Constitutional:      Appearance: Normal appearance.  She is well-developed and well-groomed. She is obese.  HENT:     Head: Normocephalic and atraumatic.  Eyes:     Conjunctiva/sclera: Conjunctivae normal.     Pupils: Pupils are equal, round, and reactive to light.  Cardiovascular:     Rate and Rhythm: Normal rate and regular rhythm.     Heart sounds: Normal heart sounds. No murmur heard.   Pulmonary:     Effort: Pulmonary effort is normal.     Breath sounds: Normal breath sounds.  Skin:    General: Skin is warm and dry.  Neurological:     General: No focal deficit present.     Mental Status: She is alert and oriented to person, place, and time. Mental status is at baseline.     Gait: Gait normal.  Psychiatric:        Attention and Perception: Attention and perception normal.        Mood and Affect: Mood and affect normal.        Speech: Speech normal.        Behavior: Behavior normal. Behavior is cooperative.        Thought Content: Thought content normal.        Cognition and Memory: Cognition and memory normal.        Judgment: Judgment normal.     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 -f/u [facility]  h/o 05/2020 will get CT chest and MRI ab/pelvis and consider [facility] urology if kidney cancer changing   HM Flu [id number] pna 23utd [place] 3/3 utd Consider Tdap in futuresent to pharmacy prevnar had 12/18/15, pna 23 utd  shingrix 2/2 doses utd  rec hep B vaccine in the past negative hep B ab qualitative 12/18/15   Of note elevated TSH 05/24/2018 elevated TSH Subclinical hypothyroidism-repeat TSH in the future hcv neg 08/19/15  Pap neg 07/2014 [facility] and out of age window pap  11/16/20mammogramnegativereferred today  colonoscopy h/o polyps she did do cologuard 01/08/16 negative but with h/o polyps colonoscopy preferredshe is agreeable in the future seen [facility] in the past FIT test 04/03/2019 negative  DEXA normal 07/2014  Former smoker quit 40 years ago low risk and does not meet criteria see below    Provider: Dr. Olivia Mackie McLean-Scocuzza-Internal Medicine

## 2020-02-25 ENCOUNTER — Other Ambulatory Visit (INDEPENDENT_AMBULATORY_CARE_PROVIDER_SITE_OTHER): Payer: Medicare PPO

## 2020-02-25 ENCOUNTER — Other Ambulatory Visit: Payer: Self-pay

## 2020-02-25 DIAGNOSIS — E1159 Type 2 diabetes mellitus with other circulatory complications: Secondary | ICD-10-CM

## 2020-02-25 DIAGNOSIS — Z1329 Encounter for screening for other suspected endocrine disorder: Secondary | ICD-10-CM | POA: Diagnosis not present

## 2020-02-25 DIAGNOSIS — I152 Hypertension secondary to endocrine disorders: Secondary | ICD-10-CM | POA: Diagnosis not present

## 2020-02-25 DIAGNOSIS — R946 Abnormal results of thyroid function studies: Secondary | ICD-10-CM

## 2020-02-25 LAB — LIPID PANEL
Cholesterol: 133 mg/dL (ref 0–200)
HDL: 42.6 mg/dL (ref >39.00)
LDL Cholesterol: 78 mg/dL (ref 0–99)
NonHDL: 90.6
Total CHOL/HDL Ratio: 3
Triglycerides: 62 mg/dL (ref 0.0–149.0)
VLDL: 12.4 mg/dL (ref 0.0–40.0)

## 2020-02-25 LAB — COMPREHENSIVE METABOLIC PANEL
ALT: 13 U/L (ref 0–35)
AST: 12 U/L (ref 0–37)
Albumin: 3.9 g/dL (ref 3.5–5.2)
Alkaline Phosphatase: 71 U/L (ref 39–117)
BUN: 11 mg/dL (ref 6–23)
CO2: 33 mEq/L — ABNORMAL HIGH (ref 19–32)
Calcium: 10.1 mg/dL (ref 8.4–10.5)
Chloride: 96 mEq/L (ref 96–112)
Creatinine, Ser: 0.8 mg/dL (ref 0.40–1.20)
GFR: 72.51 mL/min (ref >60.00)
Glucose, Bld: 91 mg/dL (ref 70–99)
Potassium: 4.2 mEq/L (ref 3.5–5.1)
Sodium: 136 mEq/L (ref 135–145)
Total Bilirubin: 0.4 mg/dL (ref 0.2–1.2)
Total Protein: 6.6 g/dL (ref 6.0–8.3)

## 2020-02-25 LAB — CBC WITH DIFFERENTIAL/PLATELET
Basophils Absolute: 0.1 10*3/uL (ref 0.0–0.1)
Basophils Relative: 1.3 % (ref 0.0–3.0)
Eosinophils Absolute: 0.4 10*3/uL (ref 0.0–0.7)
Eosinophils Relative: 7.6 % — ABNORMAL HIGH (ref 0.0–5.0)
HCT: 40.4 % (ref 36.0–46.0)
Hemoglobin: 13.2 g/dL (ref 12.0–15.0)
Lymphocytes Relative: 29.7 % (ref 12.0–46.0)
Lymphs Abs: 1.6 10*3/uL (ref 0.7–4.0)
MCHC: 32.6 g/dL (ref 30.0–36.0)
MCV: 87.7 fl (ref 78.0–100.0)
Monocytes Absolute: 0.4 10*3/uL (ref 0.1–1.0)
Monocytes Relative: 8.1 % (ref 3.0–12.0)
Neutro Abs: 2.8 10*3/uL (ref 1.4–7.7)
Neutrophils Relative %: 53.3 % (ref 43.0–77.0)
Platelets: 331 10*3/uL (ref 150.0–400.0)
RBC: 4.6 Mil/uL (ref 3.87–5.11)
RDW: 15.1 % (ref 11.5–15.5)
WBC: 5.3 10*3/uL (ref 4.0–10.5)

## 2020-02-25 LAB — TSH: TSH: 2.95 u[IU]/mL (ref 0.35–4.50)

## 2020-02-25 LAB — HEMOGLOBIN A1C: Hgb A1c MFr Bld: 6.3 % (ref 4.6–6.5)

## 2020-02-26 ENCOUNTER — Telehealth: Payer: Self-pay | Admitting: Internal Medicine

## 2020-02-26 LAB — URINALYSIS, ROUTINE W REFLEX MICROSCOPIC
Bilirubin Urine: NEGATIVE
Glucose, UA: NEGATIVE
Hgb urine dipstick: NEGATIVE
Hyaline Cast: NONE SEEN /LPF
Ketones, ur: NEGATIVE
Nitrite: NEGATIVE
Specific Gravity, Urine: 1.02 (ref 1.001–1.03)
pH: 7 (ref 5.0–8.0)

## 2020-02-26 LAB — MICROALBUMIN / CREATININE URINE RATIO
Creatinine, Urine: 206 mg/dL (ref 20–275)
Microalb Creat Ratio: 5 mcg/mg creat (ref <30)
Microalb, Ur: 1.1 mg/dL

## 2020-02-26 NOTE — Telephone Encounter (Signed)
Cologuard ordered placed through [employer] epic portal.

## 2020-02-27 ENCOUNTER — Telehealth: Payer: Self-pay | Admitting: Internal Medicine

## 2020-02-27 NOTE — Telephone Encounter (Signed)
Faxed to Dr.B . [place] doctor notes faxed on 02-26-2020

## 2020-03-17 ENCOUNTER — Telehealth: Payer: Self-pay | Admitting: Internal Medicine

## 2020-03-17 NOTE — Telephone Encounter (Signed)
Left message for patient to call back and schedule Medicare Annual Wellness Visit (AWV)   This should be a telephone visit only=30 minutes.  Last AWV 01/23/19; please schedule at anytime with Denisa O'Brien-Blaney at [place] 

## 2020-05-12 DIAGNOSIS — K769 Liver disease, unspecified: Secondary | ICD-10-CM | POA: Diagnosis not present

## 2020-05-12 DIAGNOSIS — N2889 Other specified disorders of kidney and ureter: Secondary | ICD-10-CM | POA: Diagnosis not present

## 2020-05-12 DIAGNOSIS — C7A8 Other malignant neuroendocrine tumors: Secondary | ICD-10-CM | POA: Diagnosis not present

## 2020-05-15 ENCOUNTER — Telehealth: Payer: Self-pay | Admitting: Internal Medicine

## 2020-05-15 ENCOUNTER — Telehealth: Payer: Self-pay

## 2020-05-15 DIAGNOSIS — E119 Type 2 diabetes mellitus without complications: Secondary | ICD-10-CM

## 2020-05-15 NOTE — Telephone Encounter (Signed)
Left message to return call 

## 2020-05-15 NOTE — Telephone Encounter (Signed)
Please just resend preferred supplies meter lancets strips to check 2 x per day for diabetes to pharmacy   Thank you

## 2020-05-15 NOTE — Telephone Encounter (Signed)
Faxed non formulary exception form about advair/wixela back to [employer] @ [phone] on 05/15/20

## 2020-05-15 NOTE — Telephone Encounter (Signed)
Insurance company calling in to verbally complete the PA for diabetic supplies for Accu check glucose meter and supplies.   PA was denied due to there not being a medical reason Patient is incapable of using the preferred brands.   Preferred brands for this Patient are OneTouch or Contour supplies. States that it can be any of the one touch meters.

## 2020-05-22 NOTE — Telephone Encounter (Signed)
Student Medical assistant [place] Left message to return call.

## 2020-05-26 MED ORDER — BLOOD GLUCOSE METER KIT: PACK | 0 refills | Status: AC

## 2020-05-26 NOTE — Telephone Encounter (Signed)
Unable to contact Patient. Order faxed to pharmacy for covered meter and supplies

## 2020-05-28 LAB — COLOGUARD: Cologuard: NEGATIVE

## 2020-05-30 ENCOUNTER — Encounter: Payer: Self-pay | Admitting: Internal Medicine

## 2020-05-30 ENCOUNTER — Telehealth: Payer: Self-pay | Admitting: Internal Medicine

## 2020-05-30 NOTE — Telephone Encounter (Signed)
cologuard negative  Hope shes doing well

## 2020-05-30 NOTE — Telephone Encounter (Signed)
Left message for patient to call back and schedule Medicare Annual Wellness Visit (AWV)   This should be a telephone visit only=30 minutes.  Last AWV 01/23/19; please schedule at anytime with Denisa O'Brien-Blaney at [facility]

## 2020-06-02 NOTE — Telephone Encounter (Signed)
Left message to return call 

## 2020-06-05 NOTE — Telephone Encounter (Signed)
Patient informed and verbalized understanding

## 2020-06-24 NOTE — Telephone Encounter (Signed)
Pt states that she does not want to have AWV right now and will call back to reschedule

## 2020-07-02 DIAGNOSIS — H5203 Hypermetropia, bilateral: Secondary | ICD-10-CM | POA: Diagnosis not present

## 2020-07-02 DIAGNOSIS — H52223 Regular astigmatism, bilateral: Secondary | ICD-10-CM | POA: Diagnosis not present

## 2020-07-02 DIAGNOSIS — H35371 Puckering of macula, right eye: Secondary | ICD-10-CM | POA: Diagnosis not present

## 2020-07-02 DIAGNOSIS — H524 Presbyopia: Secondary | ICD-10-CM | POA: Diagnosis not present

## 2020-07-02 DIAGNOSIS — Z7984 Long term (current) use of oral hypoglycemic drugs: Secondary | ICD-10-CM | POA: Diagnosis not present

## 2020-07-02 DIAGNOSIS — E119 Type 2 diabetes mellitus without complications: Secondary | ICD-10-CM | POA: Diagnosis not present

## 2020-07-02 DIAGNOSIS — Z961 Presence of intraocular lens: Secondary | ICD-10-CM | POA: Diagnosis not present

## 2020-07-02 LAB — HM DIABETES EYE EXAM

## 2020-07-08 ENCOUNTER — Encounter: Payer: Self-pay | Admitting: Internal Medicine

## 2020-07-14 ENCOUNTER — Other Ambulatory Visit: Payer: Self-pay | Admitting: Internal Medicine

## 2020-07-14 DIAGNOSIS — I10 Essential (primary) hypertension: Secondary | ICD-10-CM

## 2020-07-22 ENCOUNTER — Ambulatory Visit: Payer: Medicare PPO | Admitting: Internal Medicine

## 2020-07-23 ENCOUNTER — Other Ambulatory Visit: Payer: Self-pay | Admitting: Internal Medicine

## 2020-07-23 ENCOUNTER — Telehealth: Payer: Self-pay

## 2020-07-23 DIAGNOSIS — E119 Type 2 diabetes mellitus without complications: Secondary | ICD-10-CM

## 2020-07-23 DIAGNOSIS — I10 Essential (primary) hypertension: Secondary | ICD-10-CM

## 2020-07-23 DIAGNOSIS — J4520 Mild intermittent asthma, uncomplicated: Secondary | ICD-10-CM

## 2020-07-23 DIAGNOSIS — R059 Cough, unspecified: Secondary | ICD-10-CM

## 2020-07-23 MED ORDER — FLUTICASONE-SALMETEROL 250-50 MCG/DOSE IN AEPB: INHALATION_SPRAY | RESPIRATORY_TRACT | 11 refills | Status: DC

## 2020-07-23 MED ORDER — AMLODIPINE BESYLATE 10 MG PO TABS: 10.0000 mg | ORAL_TABLET | Freq: Every day | ORAL | 3 refills | Status: DC

## 2020-07-23 MED ORDER — MONTELUKAST SODIUM 10 MG PO TABS: 10.0000 mg | ORAL_TABLET | Freq: Every day | ORAL | 3 refills | Status: DC

## 2020-07-23 MED ORDER — ALBUTEROL SULFATE HFA 108 (90 BASE) MCG/ACT IN AERS
1.0000 | INHALATION_SPRAY | Freq: Four times a day (QID) | RESPIRATORY_TRACT | 11 refills | Status: DC | PRN
Start: 2020-07-23 — End: 2021-04-30

## 2020-07-23 MED ORDER — CARVEDILOL 6.25 MG PO TABS: 6.2500 mg | ORAL_TABLET | Freq: Two times a day (BID) | ORAL | 3 refills | Status: DC

## 2020-07-23 MED ORDER — METFORMIN HCL 1000 MG PO TABS: 1000.0000 mg | ORAL_TABLET | Freq: Every day | ORAL | 3 refills | Status: DC

## 2020-07-23 MED ORDER — HYDROCOD POLST-CPM POLST ER 10-8 MG/5ML PO SUER: 5.0000 mL | Freq: Every evening | ORAL | 0 refills | Status: DC | PRN

## 2020-07-23 MED ORDER — ATORVASTATIN CALCIUM 80 MG PO TABS: 80.0000 mg | ORAL_TABLET | Freq: Every day | ORAL | 3 refills | Status: DC

## 2020-07-23 NOTE — Telephone Encounter (Signed)
Pt called and states that she has a self diagnosed upper resp infection. No appts avail with any provider at the time of the call. I suggested the UC across the street or [place] walk in. She states she just needs medication and states that Dr Kelly Services has called her in a "hyrdo suspension" medication before. She states that she had a little bit left and it made her feel better. She wanted me to send message back to Dr Kelly Services. Please advise

## 2020-07-23 NOTE — Telephone Encounter (Signed)
Sent temp supply of medications negative covid test home per pt Likely asthma flare with pollen cts high encouraged otc allergy meds and inhalers  Call for f/u appt if not better

## 2020-07-24 NOTE — Telephone Encounter (Signed)
Noted Patient spoke with Dr Olivia Mackie McLean-Scocuzza

## 2020-08-01 ENCOUNTER — Other Ambulatory Visit: Payer: Self-pay | Admitting: Internal Medicine

## 2020-08-01 DIAGNOSIS — Z1231 Encounter for screening mammogram for malignant neoplasm of breast: Secondary | ICD-10-CM

## 2020-08-07 ENCOUNTER — Other Ambulatory Visit: Payer: Self-pay

## 2020-08-07 ENCOUNTER — Ambulatory Visit
Admission: RE | Admit: 2020-08-07 | Discharge: 2020-08-07 | Disposition: A | Discharge status: Home / Self Care | Payer: Medicare PPO | Source: Ambulatory Visit | Attending: Internal Medicine | Admitting: Internal Medicine

## 2020-08-07 DIAGNOSIS — Z1231 Encounter for screening mammogram for malignant neoplasm of breast: Secondary | ICD-10-CM | POA: Insufficient documentation

## 2020-08-08 ENCOUNTER — Encounter: Payer: Self-pay | Admitting: Internal Medicine

## 2020-08-08 ENCOUNTER — Ambulatory Visit: Payer: Medicare PPO | Admitting: Internal Medicine

## 2020-08-08 VITALS — BP 130/76 | HR 74 | Temp 98.0°F | Ht 59.5 in | Wt 161.6 lb

## 2020-08-08 DIAGNOSIS — Z1231 Encounter for screening mammogram for malignant neoplasm of breast: Secondary | ICD-10-CM

## 2020-08-08 DIAGNOSIS — Z23 Encounter for immunization: Secondary | ICD-10-CM | POA: Diagnosis not present

## 2020-08-08 DIAGNOSIS — E1159 Type 2 diabetes mellitus with other circulatory complications: Secondary | ICD-10-CM | POA: Diagnosis not present

## 2020-08-08 DIAGNOSIS — I152 Hypertension secondary to endocrine disorders: Secondary | ICD-10-CM | POA: Diagnosis not present

## 2020-08-08 DIAGNOSIS — R7989 Other specified abnormal findings of blood chemistry: Secondary | ICD-10-CM

## 2020-08-08 DIAGNOSIS — B351 Tinea unguium: Secondary | ICD-10-CM | POA: Diagnosis not present

## 2020-08-08 DIAGNOSIS — Z0000 Encounter for general adult medical examination without abnormal findings: Secondary | ICD-10-CM | POA: Diagnosis not present

## 2020-08-08 DIAGNOSIS — R600 Localized edema: Secondary | ICD-10-CM | POA: Diagnosis not present

## 2020-08-08 MED ORDER — UREA 47 % EX CREA
1.0000 | TOPICAL_CREAM | Freq: Every day | CUTANEOUS | 2 refills | Status: DC
Start: 2020-08-08 — End: 2021-04-30

## 2020-08-08 MED ORDER — TERBINAFINE HCL 250 MG PO TABS: 250.0000 mg | ORAL_TABLET | Freq: Every day | ORAL | 0 refills | Status: DC

## 2020-08-08 MED ORDER — TETANUS-DIPHTH-ACELL PERTUSSIS 5-2.5-18.5 LF-MCG/0.5 IM SUSP: 0.5000 mL | Freq: Once | INTRAMUSCULAR | 0 refills | Status: AC

## 2020-08-08 NOTE — Patient Instructions (Addendum)
Consider vaccine 4th pfizer  -->dose 2-4 weeks consider this at the pharmacy     Consider Tdap vaccine

## 2020-08-08 NOTE — Progress Notes (Signed)
Chief Complaint  Patient presents with  . Follow-up   Annual  1. Onychomycosis did not pick up lamisil 250 mg qd, urea  Cream still has toenail fungus  2. Cough runny nose with clear thick green mucous resolved with tussionex, albuterol, advair 250/50 zpack with h/o carcinoid of lung left, asthma 3. htn controlled coreg 6.25 mg bid, hyzaar 100-25 mg qd, norvasc 10 mg qd with some pedal edema but tolerable 4. Dm 2 controlled on metformin 1000 mg bid 5. Renal cell ca of left kidney stable and f/u with [facility] heme/onc  Review of Systems  Constitutional: Negative for weight loss.  HENT: Negative for hearing loss.   Eyes: Negative for blurred vision.  Respiratory: Positive for shortness of breath.   Cardiovascular: Positive for leg swelling. Negative for chest pain.  Gastrointestinal: Negative for abdominal pain.  Musculoskeletal: Negative for falls and joint pain.  Skin: Negative for rash.  Neurological: Negative for headaches.  Psychiatric/Behavioral: Negative for depression.   Past Medical History:  Diagnosis Date  . Asthma   . Chicken pox   . CTS (carpal tunnel syndrome)   . Diabetes ([place])   . Fibroids   . History of colon polyps   . History of shingles   . Hypercalcemia    resolved after extra calcium and hctz stopped   . Hypercalcemia    07/2013 stopped calcium and normalized   . Hypertension   . Shingles   . Sinus problem   . Thyroid disease    abnormal thyroid tests TSH elevated 07/2014 Dr. Dionisio David  . Vitamin D deficiency    Past Surgical History:  Procedure Laterality Date  . arm surgery     left arm age [age] y.o   . CATARACT EXTRACTION     right eye 09/08/18 Dr.Beavis  . EYE SURGERY    . TUBAL LIGATION     Family History  Problem Relation Age of Onset  . Diabetes Mother   . Heart disease Mother        MI  . Hypertension Mother   . Hyperlipidemia Mother   . Stroke Mother   . Alcohol abuse Father   . Cancer Father   . Hyperlipidemia Sister   .  Hypertension Sister   . Alcohol abuse Brother   . Early death Brother   . Alcohol abuse Brother   . Cancer Brother   . Depression Brother   . Hyperlipidemia Brother   . Breast cancer Neg Hx    Social History   Socioeconomic History  . Marital status: Married    Spouse name: Not on file  . Number of children: Not on file  . Years of education: Not on file  . Highest education level: Not on file  Occupational History  . Not on file  Tobacco Use  . Smoking status: Former Research scientist (life sciences)  . Smokeless tobacco: Never Used  Vaping Use  . Vaping Use: Never used  Substance and Sexual Activity  . Alcohol use: No  . Drug use: No  . Sexual activity: Yes    Comment: husband  Other Topics Concern  . Not on file  Social History Narrative   1 year of college retired    Married    1 daughter summer in [place] aged 61 as of 10/2018 and 1 son in [place]    Had grandson Vivia Budge 05/23/19 by daughter Summer   Social Determinants of Health   Financial Resource Strain: Not on [employer] Insecurity: Not on  file  Transportation Needs: Not on file  Physical Activity: Not on file  Stress: Not on file  Social Connections: Not on file  Intimate Partner Violence: Not on file   Current Meds  Medication Sig  . albuterol (VENTOLIN HFA) 108 (90 Base) MCG/ACT inhaler Inhale 1-2 puffs into the lungs every 6 (six) hours as needed for wheezing or shortness of breath.  Marland Kitchen amLODipine (NORVASC) 10 MG tablet Take 1 tablet (10 mg total) by mouth daily.  . Ascorbic Acid (VITAMIN C PO) Take 1 tablet by mouth daily.   Marland Kitchen atorvastatin (LIPITOR) 80 MG tablet Take 1 tablet (80 mg total) by mouth daily.  . blood glucose meter kit and supplies Dispense based on patient and insurance preference. Use two times daily as directed. (FOR ICD-10 E11.9).  . carvedilol (COREG) 6.25 MG tablet Take 1 tablet (6.25 mg total) by mouth 2 (two) times daily with a meal.  . Fluticasone-Salmeterol (ADVAIR DISKUS) 250-50 MCG/DOSE AEPB  INHALE 1 PUFF VIA INHALER TWO TIMES PER DAY, IN THE MORNING AND EVENING, APPROXIMATELY 12 HOURS APART RINSE mouth after each use  . glucose blood (ACCU-CHEK SMARTVIEW) test strip USE 1 STRIP DAILY  . Lancets (ACCU-CHEK SOFT TOUCH) lancets FILL FASTCLIX LANCETS 102s Check 1x per day Use as instructed E11.9  . losartan-hydrochlorothiazide (HYZAAR) 100-25 MG tablet TAKE 1 TABLET BY MOUTH DAILY  . metFORMIN (GLUCOPHAGE) 1000 MG tablet Take 1 tablet (1,000 mg total) by mouth daily with breakfast.  . montelukast (SINGULAIR) 10 MG tablet Take 1 tablet (10 mg total) by mouth daily. At night  . [EXPIRED] Tdap (BOOSTRIX) 5-2.5-18.5 LF-MCG/0.5 injection Inject 0.5 mLs into the muscle once for 1 dose.   No Known Allergies Recent Results (from the past 2160 hour(s))  HM DIABETES EYE EXAM     Status: None   Collection Time: 07/02/20 12:00 AM  Result Value Ref Range   HM Diabetic Eye Exam No Retinopathy No Retinopathy    Comment: Dr, Matilde Sprang no macular edema    Objective  Body mass index is 32.09 kg/m. Wt Readings from Last 3 Encounters:  08/08/20 161 lb 9.6 oz (73.3 kg)  02/20/20 157 lb 6.4 oz (71.4 kg)  11/14/19 166 lb (75.3 kg)   Temp Readings from Last 3 Encounters:  08/08/20 98 F (36.7 C) (Oral)  02/20/20 97.6 F (36.4 C) (Oral)  11/14/19 97.8 F (36.6 C) (Temporal)   BP Readings from Last 3 Encounters:  08/08/20 130/76  02/20/20 (!) 146/90  11/14/19 107/64   Pulse Readings from Last 3 Encounters:  08/08/20 74  02/20/20 75  11/14/19 84    Physical Exam Vitals and nursing note reviewed.  Constitutional:      Appearance: Normal appearance. She is well-developed and well-groomed. She is obese.  HENT:     Head: Normocephalic and atraumatic.  Cardiovascular:     Rate and Rhythm: Normal rate and regular rhythm.     Heart sounds: Normal heart sounds. No murmur heard.   Pulmonary:     Effort: Pulmonary effort is normal.     Breath sounds: Normal breath sounds.  Skin:    General:  Skin is warm and dry.  Neurological:     General: No focal deficit present.     Mental Status: She is alert and oriented to person, place, and time. Mental status is at baseline.     Gait: Gait normal.  Psychiatric:        Attention and Perception: Attention and perception normal.  Mood and Affect: Mood and affect normal.        Speech: Speech normal.        Behavior: Behavior normal. Behavior is cooperative.        Thought Content: Thought content normal.        Cognition and Memory: Cognition and memory normal.        Judgment: Judgment normal.     Assessment  Plan  Annual physical exam Flu [id number] pna 23utd Pfizer 3/3 utd consider booster Consider Tdap in futuresent to pharmacy prevnar had 12/18/15, pna 23 utd shingrix 2/2 doses utd  rec hep B vaccine in the past negative hep B ab qualitative 12/18/15   hcv neg 08/19/15  Pap neg 07/2014 [facility] and out of age window pap  08/07/20 negmammogramnegativereferred today5/5/23  colonoscopy h/o polyps she did do cologuard 01/08/16 negative but with h/o polyps colonoscopy preferredshe is agreeable in the future seen [facility] in the past FIT test 04/03/2019 negative 05/21/20 cologaurd negative  DEXA normal 07/2014  Former smoker quit 40 years ago low risk and does not meet criteria see below [facility] CT scans [facility]   rec healthy diet and exercise   Onychomycosis - Plan: Urea 47 % CREA, terbinafine (LAMISIL) 250 MG tablet  Hypertension associated with diabetes ([place]) controlled A1C 6.3- Plan: ECHOCARDIOGRAM COMPLETE coreg 6.25 mg bid, hyzaar 100-25 mg qd, norvasc 10 mg qd  Metformin 1000 mg bid  Had labs cmet, cbc normal ,05/12/20 [facility] ca 10.3 elevated Foot exam today nl monofilament   Leg edema - Plan: ECHOCARDIOGRAM COMPLETE       Provider: Dr. Olivia Mackie McLean-Scocuzza-Internal Medicine

## 2020-08-25 DIAGNOSIS — Z0000 Encounter for general adult medical examination without abnormal findings: Secondary | ICD-10-CM | POA: Insufficient documentation

## 2020-08-26 ENCOUNTER — Telehealth: Payer: Self-pay

## 2020-08-26 NOTE — Telephone Encounter (Signed)
Lab orders have been mailed to pt per Dr. Audrie Gallus request.

## 2020-09-24 ENCOUNTER — Other Ambulatory Visit: Payer: Self-pay | Admitting: Internal Medicine

## 2020-09-24 DIAGNOSIS — I10 Essential (primary) hypertension: Secondary | ICD-10-CM

## 2020-09-24 DIAGNOSIS — J4520 Mild intermittent asthma, uncomplicated: Secondary | ICD-10-CM

## 2020-10-30 ENCOUNTER — Telehealth (INDEPENDENT_AMBULATORY_CARE_PROVIDER_SITE_OTHER): Payer: Medicare PPO | Admitting: Internal Medicine

## 2020-10-30 ENCOUNTER — Encounter: Payer: Self-pay | Admitting: Internal Medicine

## 2020-10-30 ENCOUNTER — Other Ambulatory Visit: Payer: Self-pay

## 2020-10-30 VITALS — Ht 59.5 in | Wt 155.0 lb

## 2020-10-30 DIAGNOSIS — L918 Other hypertrophic disorders of the skin: Secondary | ICD-10-CM | POA: Diagnosis not present

## 2020-10-30 DIAGNOSIS — M545 Low back pain, unspecified: Secondary | ICD-10-CM

## 2020-10-30 DIAGNOSIS — G8929 Other chronic pain: Secondary | ICD-10-CM

## 2020-10-30 DIAGNOSIS — M47814 Spondylosis without myelopathy or radiculopathy, thoracic region: Secondary | ICD-10-CM | POA: Diagnosis not present

## 2020-10-30 DIAGNOSIS — L989 Disorder of the skin and subcutaneous tissue, unspecified: Secondary | ICD-10-CM | POA: Diagnosis not present

## 2020-10-30 NOTE — Progress Notes (Signed)
Telephone Note  I connected with Tracy Torres   on 10/30/20 at  3:30 PM EDT by telphone and verified that I am speaking with the correct person using two identifiers.  Location patient: home, [place] Location provider:work or home office Persons participating in the virtual visit: patient, provider  I discussed the limitations of evaluation and management by telemedicine and the availability of in person appointments. The patient expressed understanding and agreed to proceed.   HPI: Chronic low back pain and mid back pain mild to severe wants referral to [place]  2. Under right eye right cheek darker lesion withh/o freckles noticed since 09/2020, scalp lesions and skin tags wants referral to dermatology   ROS: See pertinent positives and negatives per HPI.  Past Medical History:  Diagnosis Date   Asthma    Chicken pox    CTS (carpal tunnel syndrome)    Diabetes ([place])    Fibroids    History of colon polyps    History of shingles    Hypercalcemia    resolved after extra calcium and hctz stopped    Hypercalcemia    07/2013 stopped calcium and normalized    Hypertension    Shingles    Sinus problem    Thyroid disease    abnormal thyroid tests TSH elevated 07/2014 Dr. Dionisio David   Vitamin D deficiency     Past Surgical History:  Procedure Laterality Date   arm surgery     left arm age [age] y.o    CATARACT EXTRACTION     right eye 09/08/18 Dr.Beavis   EYE SURGERY     TUBAL LIGATION       Current Outpatient Medications:    albuterol (VENTOLIN HFA) 108 (90 Base) MCG/ACT inhaler, Inhale 1-2 puffs into the lungs every 6 (six) hours as needed for wheezing or shortness of breath., Disp: 18 g, Rfl: 11   amLODipine (NORVASC) 10 MG tablet, TAKE 1 TABLET(10 MG) BY MOUTH DAILY, Disp: 90 tablet, Rfl: 3   Ascorbic Acid (VITAMIN C PO), Take 1 tablet by mouth daily. , Disp: , Rfl:    atorvastatin (LIPITOR) 80 MG tablet, Take 1 tablet (80 mg total) by mouth daily., Disp: 90  tablet, Rfl: 3   blood glucose meter kit and supplies, Dispense based on patient and insurance preference. Use two times daily as directed. (FOR ICD-10 E11.9)., Disp: 1 each, Rfl: 0   carvedilol (COREG) 6.25 MG tablet, Take 1 tablet (6.25 mg total) by mouth 2 (two) times daily with a meal., Disp: 180 tablet, Rfl: 3   Fluticasone-Salmeterol (ADVAIR DISKUS) 250-50 MCG/DOSE AEPB, INHALE 1 PUFF VIA INHALER TWO TIMES PER DAY, IN THE MORNING AND EVENING, APPROXIMATELY 12 HOURS APART RINSE mouth after each use, Disp: 60 each, Rfl: 11   glucose blood (ACCU-CHEK SMARTVIEW) test strip, USE 1 STRIP DAILY, Disp: 100 each, Rfl: 11   Lancets (ACCU-CHEK SOFT TOUCH) lancets, FILL FASTCLIX LANCETS 102s Check 1x per day Use as instructed E11.9, Disp: 100 each, Rfl: 12   losartan-hydrochlorothiazide (HYZAAR) 100-25 MG tablet, TAKE 1 TABLET BY MOUTH DAILY, Disp: 90 tablet, Rfl: 3   metFORMIN (GLUCOPHAGE) 1000 MG tablet, Take 1 tablet (1,000 mg total) by mouth daily with breakfast., Disp: 90 tablet, Rfl: 3   montelukast (SINGULAIR) 10 MG tablet, TAKE 1 TABLET(10 MG) BY MOUTH DAILY AT NIGHT, Disp: 90 tablet, Rfl: 3   terbinafine (LAMISIL) 250 MG tablet, Take 1 tablet (250 mg total) by mouth daily., Disp: 90 tablet, Rfl: 0  Urea 47 % CREA, Apply 1 application topically daily. Toenails, Disp: 142 g, Rfl: 2  EXAM:  VITALS per patient if applicable:  GENERAL: alert, oriented, appears well and in no acute distress  PSYCH/NEURO: pleasant and cooperative, no obvious depression or anxiety, speech and thought processing grossly intact  ASSESSMENT AND PLAN:  Discussed the following assessment and plan:  Skin tag - Plan: Ambulatory referral to Dermatology  Skin lesion of face - Plan: Ambulatory referral to Dermatology  Scalp lesion - Plan: Ambulatory referral to Dermatology  Thoracic arthritis - Plan: Ambulatory referral to Physical Therapy [facility]  Chronic low back pain without sciatica, unspecified back pain  laterality - Plan: Ambulatory referral to Physical Therapy  -we discussed possible serious and likely etiologies, options for evaluation and workup, limitations of telemedicine visit vs in person visit, treatment, treatment risks and precautions. Pt prefers to treat via telemedicine empirically rather than in person at this moment.     I discussed the assessment and treatment plan with the patient. The patient was provided an opportunity to ask questions and all were answered. The patient agreed with the plan and demonstrated an understanding of the instructions.    Time spent 10 min Delorise Jackson, MD

## 2020-10-31 ENCOUNTER — Ambulatory Visit: Payer: Medicare PPO | Admitting: Internal Medicine

## 2020-12-02 DIAGNOSIS — L918 Other hypertrophic disorders of the skin: Secondary | ICD-10-CM | POA: Diagnosis not present

## 2020-12-02 DIAGNOSIS — L821 Other seborrheic keratosis: Secondary | ICD-10-CM | POA: Diagnosis not present

## 2020-12-02 DIAGNOSIS — D224 Melanocytic nevi of scalp and neck: Secondary | ICD-10-CM | POA: Diagnosis not present

## 2021-01-16 ENCOUNTER — Telehealth: Payer: Self-pay | Admitting: Internal Medicine

## 2021-01-16 NOTE — Telephone Encounter (Signed)
Patient recently started a substitute teacher job and they are requesting that she have a tb skin test done as well as have paperwork filled out.She will bring in her paperwork today to be looked over and she would like to set up a tb skin test.Please advise.

## 2021-01-19 NOTE — Telephone Encounter (Signed)
Okay to arrange tb skin test

## 2021-01-20 NOTE — Telephone Encounter (Signed)
Left a message for Tracy Torres to call back and schedule a 15 min nurse visit for TB placement

## 2021-01-21 ENCOUNTER — Other Ambulatory Visit: Payer: Self-pay

## 2021-01-21 ENCOUNTER — Ambulatory Visit (INDEPENDENT_AMBULATORY_CARE_PROVIDER_SITE_OTHER): Payer: Medicare PPO | Admitting: Family Medicine

## 2021-01-21 DIAGNOSIS — E119 Type 2 diabetes mellitus without complications: Secondary | ICD-10-CM

## 2021-01-21 DIAGNOSIS — I10 Essential (primary) hypertension: Secondary | ICD-10-CM

## 2021-01-21 NOTE — Assessment & Plan Note (Signed)
Seems to be well controlled.  She will continue metformin.  She will have lab work with her PCP in the next several weeks.

## 2021-01-21 NOTE — Progress Notes (Signed)
Tommi Rumps, MD Phone: [phone]  Tracy Torres is a 75 y.o. female who presents today for f/u.  DIABETES Disease Monitoring: Blood Sugar ranges-80s-90s Polyuria/phagia/dipsia- no      Optho- UTD Medications: Compliance- taking metformin Hypoglycemic symptoms- no  HYPERTENSION Disease Monitoring Home BP Monitoring similar to today Chest pain- no    Dyspnea- no Medications Compliance-  taking amlodipine, coreg, losartan, HCTZ.   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  She notes no vision issues.  No significant hearing issues.  She can carry up to 20 pounds.   Social History   Tobacco Use  Smoking Status Former  Smokeless Tobacco Never    Current Outpatient Medications on File Prior to Visit  Medication Sig Dispense Refill   albuterol (VENTOLIN HFA) 108 (90 Base) MCG/ACT inhaler Inhale 1-2 puffs into the lungs every 6 (six) hours as needed for wheezing or shortness of breath. 18 g 11   amLODipine (NORVASC) 10 MG tablet TAKE 1 TABLET(10 MG) BY MOUTH DAILY 90 tablet 3   Ascorbic Acid (VITAMIN C PO) Take 1 tablet by mouth daily.      atorvastatin (LIPITOR) 80 MG tablet Take 1 tablet (80 mg total) by mouth daily. 90 tablet 3   blood glucose meter kit and supplies Dispense based on patient and insurance preference. Use two times daily as directed. (FOR ICD-10 E11.9). 1 each 0   carvedilol (COREG) 6.25 MG tablet Take 1 tablet (6.25 mg total) by mouth 2 (two) times daily with a meal. 180 tablet 3   Fluticasone-Salmeterol (ADVAIR DISKUS) 250-50 MCG/DOSE AEPB INHALE  1 PUFF VIA INHALER TWO TIMES PER DAY, IN THE MORNING AND EVENING, APPROXIMATELY 12 HOURS APART RINSE mouth after each use 60 each 11   glucose blood (ACCU-CHEK SMARTVIEW) test strip USE 1 STRIP DAILY 100 each 11   Lancets (ACCU-CHEK SOFT TOUCH) lancets FILL FASTCLIX LANCETS 102s Check 1x per day Use as instructed E11.9 100 each 12   losartan-hydrochlorothiazide (HYZAAR) 100-25 MG tablet TAKE 1 TABLET BY MOUTH DAILY 90 tablet 3   metFORMIN (GLUCOPHAGE) 1000 MG tablet Take 1 tablet (1,000 mg total) by mouth daily with breakfast. 90 tablet 3   montelukast (SINGULAIR) 10 MG tablet TAKE 1 TABLET(10 MG) BY MOUTH DAILY AT NIGHT 90 tablet 3   terbinafine (LAMISIL) 250 MG tablet Take 1 tablet (250 mg total) by mouth daily. 90 tablet 0   Urea 47 % CREA Apply 1 application topically daily. Toenails 142 g 2   No current facility-administered medications on file prior to visit.     ROS see history of present illness  Objective  Physical Exam Vitals:   01/21/21 1438  BP: 100/60  Pulse: 81  Temp: 97.9 F (36.6 C)  SpO2: 93%    BP Readings from Last 3 Encounters:  01/21/21 100/60  08/08/20 130/76  02/20/20 (!) 146/90   Wt Readings from Last 3 Encounters:  01/21/21 168 lb 6.4 oz (76.4 kg)  10/30/20 155 lb (70.3 kg)  08/08/20 161 lb 9.6 oz (73.3 kg)    Physical Exam Constitutional:      General: She is not in acute distress.    Appearance: She is not diaphoretic.  Cardiovascular:     Rate and Rhythm: Normal rate and regular rhythm.     Heart sounds: Normal heart sounds.  Pulmonary:     Effort: Pulmonary effort is normal.     Breath sounds: Normal breath sounds.  Musculoskeletal:     Right lower leg: No edema.     Left lower leg: No edema.  Skin:    General: Skin is warm and dry.  Neurological:     Mental Status: She is alert.     Assessment/Plan: Please see individual problem list.  Problem List Items Addressed This Visit     Controlled type 2 diabetes mellitus without  complication, without long-term current use of insulin ([place])    Seems to be well controlled.  She will continue metformin.  She will have lab work with her PCP in the next several weeks.      HTN (hypertension)    Well-controlled.  She will continue amlodipine, carvedilol, losartan, HCTZ.  She has an upcoming visit with her PCP and she will have lab work at that time.      The patient needs to return for PPD to be filled out.  Once that has been completed I will be able to complete her form.   Return in 1 week (on 01/28/2021) for For a PPD placement, needs this to be read on 01/30/2021 greater than 48 hours after it was placed..  This visit occurred during the SARS-CoV-2 public health emergency.  Safety protocols were in place, including screening questions prior to the visit, additional usage of staff PPE, and extensive cleaning of exam room while observing appropriate contact time as indicated for disinfecting solutions.    Tommi Rumps, MD [place]

## 2021-01-21 NOTE — Assessment & Plan Note (Addendum)
Well-controlled.  She will continue amlodipine, carvedilol, losartan, HCTZ.  She has an upcoming visit with her PCP and she will have lab work at that time.

## 2021-01-21 NOTE — Patient Instructions (Signed)
Nice to see you. We will get you scheduled for your PPD for tuberculosis screening. You have an appointment in less than a month with Dr. Terese Door.  She should get labs at that time.

## 2021-01-22 NOTE — Telephone Encounter (Signed)
Pt scheduled for TB placement on 01/28/21

## 2021-01-27 ENCOUNTER — Ambulatory Visit: Payer: Medicare PPO

## 2021-01-28 ENCOUNTER — Ambulatory Visit: Payer: Medicare PPO

## 2021-01-29 ENCOUNTER — Ambulatory Visit: Payer: Medicare PPO | Admitting: Internal Medicine

## 2021-02-02 ENCOUNTER — Other Ambulatory Visit: Payer: Self-pay

## 2021-02-02 ENCOUNTER — Ambulatory Visit (INDEPENDENT_AMBULATORY_CARE_PROVIDER_SITE_OTHER): Payer: Medicare PPO

## 2021-02-02 DIAGNOSIS — Z111 Encounter for screening for respiratory tuberculosis: Secondary | ICD-10-CM | POA: Diagnosis not present

## 2021-02-02 NOTE — Progress Notes (Signed)
Patient presented for a PPD placement to the Left forearm. Pt complained that it felt like she was stuck twice during the formation of the Ermalinda Barrios was successfully placed and patient was given instructions to not disturb, scratch, or press on the wheal. Pt would need a follow up nurse visit between 4pm 02/04/21 and 4pm 02/05/21.

## 2021-02-03 ENCOUNTER — Ambulatory Visit: Payer: Medicare PPO

## 2021-02-04 ENCOUNTER — Other Ambulatory Visit: Payer: Self-pay

## 2021-02-04 ENCOUNTER — Ambulatory Visit (INDEPENDENT_AMBULATORY_CARE_PROVIDER_SITE_OTHER): Payer: Medicare PPO

## 2021-02-04 DIAGNOSIS — Z111 Encounter for screening for respiratory tuberculosis: Secondary | ICD-10-CM

## 2021-02-04 LAB — TB SKIN TEST
Induration: 0 mm
TB Skin Test: NEGATIVE

## 2021-02-04 NOTE — Progress Notes (Signed)
Patient presented for PPD Read, results are Negative/0MM. 

## 2021-02-12 ENCOUNTER — Ambulatory Visit: Payer: Medicare PPO | Admitting: Internal Medicine

## 2021-02-12 ENCOUNTER — Telehealth: Payer: Self-pay | Admitting: Internal Medicine

## 2021-02-12 NOTE — Telephone Encounter (Signed)
Patient no-showed today's appointment; appointment was for 11/10, provider notified for review of record. Letter sent for patient to call in and re-schedule.

## 2021-02-28 ENCOUNTER — Other Ambulatory Visit: Payer: Self-pay | Admitting: Internal Medicine

## 2021-02-28 DIAGNOSIS — R059 Cough, unspecified: Secondary | ICD-10-CM

## 2021-02-28 DIAGNOSIS — J4521 Mild intermittent asthma with (acute) exacerbation: Secondary | ICD-10-CM

## 2021-02-28 MED ORDER — HYDROCOD POLST-CPM POLST ER 10-8 MG/5ML PO SUER: 5.0000 mL | Freq: Every evening | ORAL | 0 refills | Status: DC | PRN

## 2021-02-28 MED ORDER — AZITHROMYCIN 250 MG PO TABS: ORAL_TABLET | ORAL | 0 refills | Status: DC

## 2021-03-03 ENCOUNTER — Telehealth (INDEPENDENT_AMBULATORY_CARE_PROVIDER_SITE_OTHER): Payer: Medicare PPO | Admitting: Family

## 2021-03-03 ENCOUNTER — Encounter: Payer: Self-pay | Admitting: Family

## 2021-03-03 DIAGNOSIS — R059 Cough, unspecified: Secondary | ICD-10-CM

## 2021-03-03 DIAGNOSIS — J40 Bronchitis, not specified as acute or chronic: Secondary | ICD-10-CM

## 2021-03-03 MED ORDER — HYDROCOD POLST-CPM POLST ER 10-8 MG/5ML PO SUER: 5.0000 mL | Freq: Every evening | ORAL | 0 refills | Status: DC | PRN

## 2021-03-03 NOTE — Progress Notes (Signed)
Virtual Visit via Video Note  I connected with@  on 03/03/21 at 10:00 AM EST by a video enabled telemedicine application and verified that I am speaking with the correct person using two identifiers.  Location patient: home Location provider:work  Persons participating in the virtual visit: patient, provider  I discussed the limitations of evaluation and management by telemedicine and the availability of in person appointments. The patient expressed understanding and agreed to proceed.   HPI: Acute visit  Complains of productive cough, congestion x 6 days, unchanged.  Endorses chills, fatigue. '  I dont have the flu'. She has been vaccinated for flu.  Has been taking mucinex fast max, robbitussin without relief.   No fever, sob, cp, sore throat, ear pain  She has been exposed to sick grandson  She would like prescription for Tussionex which she has had in the past that works well for her.  History of diabetes, HTN, cirrhosis  No h/o ckd  Negative covid home test 3 days ago.   No antibiotic in last 3 months.   ROS: See pertinent positives and negatives per HPI.    EXAM:  VITALS per patient if applicable: Ht 4\' 11"  (1.499 m)   BMI 34.01 kg/m  BP Readings from Last 3 Encounters:  01/21/21 100/60  08/08/20 130/76  02/20/20 (!) 146/90   Wt Readings from Last 3 Encounters:  01/21/21 168 lb 6.4 oz (76.4 kg)  10/30/20 155 lb (70.3 kg)  08/08/20 161 lb 9.6 oz (73.3 kg)    GENERAL: alert, oriented, appears well and in no acute distress  HEENT: atraumatic, conjunttiva clear, no obvious abnormalities on inspection of external nose and ears  NECK: normal movements of the head and neck  LUNGS: on inspection no signs of respiratory distress, breathing rate appears normal, no obvious gross SOB, gasping or wheezing  CV: no obvious cyanosis  MS: moves all visible extremities without noticeable abnormality  PSYCH/NEURO: pleasant and cooperative, no obvious depression or  anxiety, speech and thought processing grossly intact  ASSESSMENT AND PLAN:  Discussed the following assessment and plan:  Problem List Items Addressed This Visit       Respiratory   Bronchitis    Afebrile.  No acute respiratory distress.  Negative home COVID test.  Patient politely declined RSV or flu testing in our office or  repeat COVID test.  She does not feel that she has either of these.  She specifically asked for Tussionex as she feels if she could take tussionex  at bedtime, this would be of the most help for her.  She will let me know if symptoms were to persist or worsen.  If negative viral testing, I advised her I would start azithromycin for suspected bacterial infection if symptoms do not improve. I looked up patient on [place] Controlled Substances Reporting System PMP AWARE and saw no activity that raised concern of inappropriate use.        Relevant Medications   chlorpheniramine-HYDROcodone (TUSSIONEX PENNKINETIC ER) 10-8 MG/5ML SUER   Other Visit Diagnoses     Cough       Relevant Medications   chlorpheniramine-HYDROcodone (TUSSIONEX PENNKINETIC ER) 10-8 MG/5ML SUER       -we discussed possible serious and likely etiologies, options for evaluation and workup, limitations of telemedicine visit vs in person visit, treatment, treatment risks and precautions. Pt prefers to treat via telemedicine empirically rather then risking or undertaking an in person visit at this moment.  .   I discussed the assessment  and treatment plan with the patient. The patient was provided an opportunity to ask questions and all were answered. The patient agreed with the plan and demonstrated an understanding of the instructions.   The patient was advised to call back or seek an in-person evaluation if the symptoms worsen or if the condition fails to improve as anticipated.   Mable Paris, FNP

## 2021-03-03 NOTE — Assessment & Plan Note (Addendum)
Afebrile.  No acute respiratory distress.  Negative home COVID test.  Patient politely declined RSV or flu testing in our office or  repeat COVID test.  She does not feel that she has either of these.  She specifically asked for Tussionex as she feels if she could take tussionex  at bedtime, this would be of the most help for her.  She will let me know if symptoms were to persist or worsen.  If negative viral testing, I advised her I would start azithromycin for suspected bacterial infection if symptoms do not improve. I looked up patient on [place] Controlled Substances Reporting System PMP AWARE and saw no activity that raised concern of inappropriate use.

## 2021-03-03 NOTE — Patient Instructions (Signed)
Start tussionex STOP robbitussin Continue mucinex  Please take cough medication at night only as needed. As we discussed, I do not recommend dosing throughout the day as coughing is a protective mechanism . It also helps to break up thick mucous.  Do not take cough suppressants with alcohol as can lead to trouble breathing. Advise caution if taking cough suppressant and operating machinery ( i.e driving a car) as you may feel very tired.   Please let me know how you are doing and if symptoms not improving

## 2021-03-04 ENCOUNTER — Telehealth: Payer: Self-pay | Admitting: Internal Medicine

## 2021-03-04 NOTE — Telephone Encounter (Signed)
noted 

## 2021-03-04 NOTE — Telephone Encounter (Signed)
Patient stated she did not get the medication she is wanted . However, she is walking not running. She is taking the zpac and robitussin, The other medication has to be ordered. She stated she is doing a lot better.

## 2021-03-26 ENCOUNTER — Other Ambulatory Visit: Payer: Self-pay | Admitting: Internal Medicine

## 2021-03-26 DIAGNOSIS — J4520 Mild intermittent asthma, uncomplicated: Secondary | ICD-10-CM

## 2021-04-30 ENCOUNTER — Other Ambulatory Visit: Payer: Self-pay

## 2021-04-30 ENCOUNTER — Ambulatory Visit: Payer: Medicare PPO | Admitting: Internal Medicine

## 2021-04-30 ENCOUNTER — Encounter: Payer: Self-pay | Admitting: Internal Medicine

## 2021-04-30 VITALS — BP 118/80 | Temp 98.2°F | Ht 59.0 in | Wt 162.0 lb

## 2021-04-30 DIAGNOSIS — E1159 Type 2 diabetes mellitus with other circulatory complications: Secondary | ICD-10-CM | POA: Diagnosis not present

## 2021-04-30 DIAGNOSIS — Z1231 Encounter for screening mammogram for malignant neoplasm of breast: Secondary | ICD-10-CM | POA: Diagnosis not present

## 2021-04-30 DIAGNOSIS — R7989 Other specified abnormal findings of blood chemistry: Secondary | ICD-10-CM

## 2021-04-30 DIAGNOSIS — E119 Type 2 diabetes mellitus without complications: Secondary | ICD-10-CM

## 2021-04-30 DIAGNOSIS — J4520 Mild intermittent asthma, uncomplicated: Secondary | ICD-10-CM | POA: Diagnosis not present

## 2021-04-30 DIAGNOSIS — R053 Chronic cough: Secondary | ICD-10-CM | POA: Diagnosis not present

## 2021-04-30 DIAGNOSIS — E559 Vitamin D deficiency, unspecified: Secondary | ICD-10-CM

## 2021-04-30 DIAGNOSIS — I10 Essential (primary) hypertension: Secondary | ICD-10-CM | POA: Diagnosis not present

## 2021-04-30 DIAGNOSIS — I152 Hypertension secondary to endocrine disorders: Secondary | ICD-10-CM

## 2021-04-30 DIAGNOSIS — Z23 Encounter for immunization: Secondary | ICD-10-CM | POA: Diagnosis not present

## 2021-04-30 DIAGNOSIS — F4321 Adjustment disorder with depressed mood: Secondary | ICD-10-CM

## 2021-04-30 MED ORDER — BUDESONIDE-FORMOTEROL FUMARATE 160-4.5 MCG/ACT IN AERO: 2.0000 | INHALATION_SPRAY | Freq: Two times a day (BID) | RESPIRATORY_TRACT | 12 refills | Status: DC

## 2021-04-30 MED ORDER — ATORVASTATIN CALCIUM 80 MG PO TABS: 80.0000 mg | ORAL_TABLET | Freq: Every day | ORAL | 3 refills | Status: DC

## 2021-04-30 MED ORDER — CARVEDILOL 6.25 MG PO TABS: 6.2500 mg | ORAL_TABLET | Freq: Two times a day (BID) | ORAL | 3 refills | Status: DC

## 2021-04-30 MED ORDER — MONTELUKAST SODIUM 10 MG PO TABS: ORAL_TABLET | ORAL | 3 refills | Status: DC

## 2021-04-30 MED ORDER — AMLODIPINE BESYLATE 10 MG PO TABS: ORAL_TABLET | ORAL | 3 refills | Status: DC

## 2021-04-30 MED ORDER — METFORMIN HCL 1000 MG PO TABS: 1000.0000 mg | ORAL_TABLET | Freq: Every day | ORAL | 3 refills | Status: DC

## 2021-04-30 MED ORDER — ALBUTEROL SULFATE HFA 108 (90 BASE) MCG/ACT IN AERS: 1.0000 | INHALATION_SPRAY | Freq: Four times a day (QID) | RESPIRATORY_TRACT | 11 refills | Status: DC | PRN

## 2021-04-30 MED ORDER — HYDROCOD POLI-CHLORPHE POLI ER 10-8 MG/5ML PO SUER: 5.0000 mL | Freq: Every evening | ORAL | 0 refills | Status: AC | PRN

## 2021-04-30 MED ORDER — TETANUS-DIPHTH-ACELL PERTUSSIS 5-2.5-18.5 LF-MCG/0.5 IM SUSP: 0.5000 mL | Freq: Once | INTRAMUSCULAR | 0 refills | Status: AC

## 2021-04-30 NOTE — Progress Notes (Signed)
Chief Complaint  Patient presents with   Annual Exam   F/u  1. Htn controlled on norvasc 10 mg qd coreg 6.25 mg bid hyzaar 100-25 mg qd with dm 2 on metformin 1000 mg bid, lipitor 80 2. Grief son died 03-14-2021 age 76 y.o sudden death ? cause  Review of Systems  Constitutional:  Negative for weight loss.  HENT:  Negative for hearing loss.   Eyes:  Negative for blurred vision.  Respiratory:  Negative for shortness of breath.   Cardiovascular:  Negative for chest pain.  Gastrointestinal:  Negative for abdominal pain and blood in stool.  Genitourinary:  Negative for dysuria.  Musculoskeletal:  Negative for falls and joint pain.  Skin:  Negative for rash.  Neurological:  Negative for headaches.  Psychiatric/Behavioral:  Negative for depression.   Past Medical History:  Diagnosis Date   Asthma    Chicken pox    CTS (carpal tunnel syndrome)    Diabetes (HCC)    Fibroids    History of colon polyps    History of shingles    Hypercalcemia    resolved after extra calcium and hctz stopped    Hypercalcemia    07/2013 stopped calcium and normalized    Hypertension    Shingles    Sinus problem    Thyroid disease    abnormal thyroid tests TSH elevated 07/2014 Dr. Dionisio David   Vitamin D deficiency    Past Surgical History:  Procedure Laterality Date   arm surgery     left arm age [age] y.o    CATARACT EXTRACTION     right eye 09/08/18 Dr.Beavis   EYE SURGERY     TUBAL LIGATION     Family History  Problem Relation Age of Onset   Diabetes Mother    Heart disease Mother        MI   Hypertension Mother    Hyperlipidemia Mother    Stroke Mother    Alcohol abuse Father    Cancer Father    Hyperlipidemia Sister    Hypertension Sister    Alcohol abuse Brother    Early death Brother    Alcohol abuse Brother    Cancer Brother    Depression Brother    Hyperlipidemia Brother    Breast cancer Neg Hx    Social History   Socioeconomic History   Marital status: Married    Spouse  name: Not on file   Number of children: Not on file   Years of education: Not on file   Highest education level: Not on file  Occupational History   Not on file  Tobacco Use   Smoking status: Former   Smokeless tobacco: Never  Vaping Use   Vaping Use: Never used  Substance and Sexual Activity   Alcohol use: No   Drug use: No   Sexual activity: Yes    Comment: husband  Other Topics Concern   Not on file  Social History Narrative   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    blood glucose meter kit and supplies Dispense based on patient and insurance preference. Use two times daily as directed. (FOR ICD-10 E11.9).   chlorpheniramine-HYDROcodone (TUSSIONEX PENNKINETIC ER) 10-8 MG/5ML Take 5 mLs by mouth at bedtime as needed.   glucose blood (ACCU-CHEK SMARTVIEW) test strip USE 1 STRIP DAILY   Lancets (ACCU-CHEK SOFT TOUCH) lancets FILL FASTCLIX LANCETS 102s Check 1x per day Use as instructed E11.9   losartan-hydrochlorothiazide (HYZAAR) 100-25 MG tablet TAKE 1 TABLET BY MOUTH DAILY   Tdap (BOOSTRIX) 5-2.5-18.5 LF-MCG/0.5 injection Inject 0.5 mLs into the muscle once for 1 dose.   [DISCONTINUED] albuterol (VENTOLIN HFA) 108 (90 Base) MCG/ACT inhaler Inhale 1-2 puffs into the lungs every 6 (six) hours as needed for wheezing or shortness of breath.   [DISCONTINUED] amLODipine (NORVASC) 10 MG tablet TAKE 1 TABLET(10 MG) BY MOUTH DAILY   [DISCONTINUED] atorvastatin  (LIPITOR) 80 MG tablet Take 1 tablet (80 mg total) by mouth daily.   [DISCONTINUED] carvedilol (COREG) 6.25 MG tablet Take 1 tablet (6.25 mg total) by mouth 2 (two) times daily with a meal.   [DISCONTINUED] metFORMIN (GLUCOPHAGE) 1000 MG tablet Take 1 tablet (1,000 mg total) by mouth daily with breakfast.   [DISCONTINUED] montelukast (SINGULAIR) 10 MG tablet TAKE 1 TABLET(10 MG) BY MOUTH DAILY AT NIGHT   [DISCONTINUED] WIXELA INHUB 250-50 MCG/ACT AEPB INHALE 1 PUFF BY MOUTH TWICE DAILY IN THE MORNING AND IN THE EVENING AND 12 HOURS APART. RINSE MOUTH AFTER USE   budesonide-formoterol (SYMBICORT) 160-4.5 MCG/ACT inhaler Inhale 2 puffs into the lungs 2 (two) times daily. Rinse mouth subsitution ok   No Known Allergies Recent Results (from the past 2160 hour(s))  TB Skin Test     Status: None   Collection Time: 02/04/21  4:47 PM  Result Value Ref Range   TB Skin Test Negative    Induration 0 mm   Objective  Body mass index is 32.72 kg/m. Wt Readings from Last 3 Encounters:  04/30/21 162 lb (73.5 kg)  01/21/21 168 lb 6.4 oz (76.4 kg)  10/30/20 155 lb (70.3 kg)   Temp Readings from Last 3 Encounters:  04/30/21 98.2 F (36.8 C) (Oral)  01/21/21 97.9 F (36.6 C) (Oral)  08/08/20 98 F (36.7 C) (Oral)   BP Readings from Last 3 Encounters:  04/30/21 118/80  01/21/21 100/60  08/08/20 130/76   Pulse Readings from Last 3 Encounters:  01/21/21 81  08/08/20 74  02/20/20 75    Physical Exam Vitals and nursing note reviewed.  Constitutional:      Appearance: Normal appearance. She is well-developed and well-groomed.  HENT:     Head: Normocephalic and atraumatic.  Eyes:     Conjunctiva/sclera: Conjunctivae normal.     Pupils: Pupils are equal, round, and reactive to light.  Cardiovascular:     Rate and Rhythm: Normal rate and regular rhythm.     Heart sounds: Normal heart sounds. No murmur heard. Pulmonary:     Effort: Pulmonary effort is normal.     Breath sounds: Normal  breath sounds.  Abdominal:     General: Abdomen is flat. Bowel sounds are normal.     Tenderness: There is no abdominal tenderness.  Musculoskeletal:        General: No tenderness.  Skin:    General: Skin is warm and dry.  Neurological:     General: No focal deficit present.     Mental Status: She is alert and oriented to person, place, and time. Mental status is at baseline.     Cranial Nerves: Cranial nerves 2-12 are intact.     Coordination: Coordination is intact.     Gait: Gait is intact.  Psychiatric:        Attention and Perception: Attention and perception normal.        Mood and Affect: Mood and affect normal.        Speech: Speech normal.        Behavior: Behavior normal. Behavior is cooperative.        Thought Content: Thought content normal.        Cognition and Memory: Cognition and memory normal.        Judgment: Judgment normal.    Assessment  Plan  Primary hypertension controlled hyzaar 100-25 norvasc 10 mg qd coreg 6.5 mg bid with DM 2  Labs  Cont metformin 1000 mg bid and lipitor 80    Chronic cough with asthma- Plan: chlorpheniramine-HYDROcodone (TUSSIONEX PENNKINETIC ER) 10-8 MG/5ML  Mild intermittent asthma without complication - Plan: albuterol (VENTOLIN HFA) 108 (90 Base) MCG/ACT inhaler, montelukast (SINGULAIR) 10 MG tablet, budesonide-formoterol (SYMBICORT) 160-4.5 MCG/ACT inhaler  Grief  Consider couseling declines meds   HM Flu shot 12/2020 pna 23 utd  Pfizer 4/4 utd consider booster Consider Tdap in future sent to pharmacy  prevnar had 12/18/15, pna 23 utd   shingrix 2/2 doses utd  rec hep B vaccine in the past negative hep B ab qualitative 12/18/15    hcv neg 08/19/15    Pap neg 07/2014 [facility] and out of age window pap    08/07/20 neg mammogram negative referred today 08/07/21    colonoscopy h/o polyps she did do cologuard 01/08/16 negative but with h/o polyps colonoscopy preferred she is agreeable in the future seen [facility] in the past  FIT  test 04/03/2019 negative  05/21/20 cologaurd negative   DEXA normal 07/2014    Former smoker quit 40 years ago low risk and does not meet criteria see below Serial CT scans [facility]    rec healthy diet and exercise    MRI 05/12/20 abdomen  2. Slightly increased size of the left inferior pole renal mass concerning  for renal cell carcinoma.    Electronically Reviewed by:  Joya Salm, MD, [facility]  Electronically Reviewed on:  05/12/2020 4:07 PM    05/12/20  Ct chest  IMPRESSION:  Multiple bilateral variably sized solid noncalcified pulmonary nodules  measuring up to 1.0 cm, largest in the left lower lobe, are not  significantly changed. Similar appearance of diffuse mild mosaic  attenuation throughout the lungs. 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     Provider: Dr. Olivia Mackie McLean-Scocuzza-[facility] Medicine

## 2021-04-30 NOTE — Patient Instructions (Addendum)
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 Events such as moving, changing jobs, and losing friends can create a sense of loss. The loss may be as serious as a major health change, divorce, death of a pet, or death of a loved one. All of these types of loss are likely to create a physical and emotional reaction known as grief. Grief is the result of a major change or an absence of something or someone that you count on. Grief is a normal reaction to loss. A variety of factors can affect your grieving experience, including: The nature of your loss. Your relationship to what or whom you lost. Your understanding of grief and how to manage it. Your support system. Be aware that when grief becomes extreme, it can lead to more severe issues like isolation, depression, anxiety, or suicidal thoughts. Talk with your health care provider if you have any of these issues. How to manage lifestyle changes Keep to your normal routine as much as possible. If you have trouble focusing or doing normal activities, it is acceptable to take some time away from your normal routine. Spend time with friends and loved ones. Eat a healthy diet, get plenty of sleep, and rest when you feel tired. How to recognize changes  The way that you deal with your grief will affect your ability to function as you normally do. When grieving, you may experience these changes: Numbness, shock, sadness, anxiety, anger, denial, and guilt. Thoughts about death. Unexpected crying. A physical sensation of emptiness in your  stomach. Problems sleeping and eating. Tiredness (fatigue). Loss of interest in normal activities. Dreaming about or imagining seeing the person who died. A need to remember what or whom you lost. Difficulty thinking about anything other than your loss for a period of time. Relief. If you have been expecting the loss for a while, you may feel a sense of relief when it happens. Follow these instructions at home: Activity Express your feelings in healthy ways, such as: Talking with others about your loss. It may be helpful to find others who have had a similar loss, such as a support group. Writing down your feelings in a journal. Doing physical activities to release stress and emotional energy. Doing creative activities like painting, sculpting, or playing or listening to music. Practicing resilience. This is the ability to recover and adjust after facing challenges. Reading some resources that encourage resilience may help you to learn ways to practice those behaviors.  General instructions Be patient with yourself and others. Allow the grieving process to happen, and remember that grieving takes time. It is likely that you may never feel completely done with some grief. You may find a way to move on while still cherishing memories and feelings about your loss. Accepting your loss is a process. It can take months or longer to adjust. Keep all follow-up visits. This is important. Where to find support To get support for managing loss: Ask your health care provider for help and recommendations, such as grief counseling or  therapy. Think about joining a support group for people who are managing a loss. Where to find more information You can find more information about managing loss from: American Society of Clinical Oncology: www.cancer.net American Psychological Association: TVStereos.ch Contact a health care provider if: Your grief is extreme and keeps getting worse. You have ongoing  grief that does not improve. Your body shows symptoms of grief, such as illness. You feel depressed, anxious, or hopeless. Get help right away if: You have thoughts about hurting yourself or others. Get help right away if you feel like you may hurt yourself or others, or have thoughts about taking your own life. Go to your nearest emergency room or: Call 911. Call the [facility] at [phone] or 988. This is open 24 hours a day. Text the Crisis Text Line at 814-582-9839. Summary Grief is the result of a major change or an absence of someone or something that you count on. Grief is a normal reaction to loss. The depth of grief and the period of recovery depend on the type of loss and your ability to adjust to the change and process your feelings. Processing grief requires patience and a willingness to accept your feelings and talk about your loss with people who are supportive. It is important to find resources that work for you and to realize that people experience grief differently. There is not one grieving process that works for everyone in the same way. Be aware that when grief becomes extreme, it can lead to more severe issues like isolation, depression, anxiety, or suicidal thoughts. Talk with your health care provider if you have any of these issues. This information is not intended to replace advice given to you by your health care provider. Make sure you discuss any questions you have with your health care provider. Document Revised: 11/10/2020 Document Reviewed: 11/10/2020 Elsevier Patient Education  [place].

## 2021-05-27 ENCOUNTER — Other Ambulatory Visit: Payer: Medicare PPO

## 2021-06-09 ENCOUNTER — Other Ambulatory Visit (INDEPENDENT_AMBULATORY_CARE_PROVIDER_SITE_OTHER): Payer: Medicare PPO

## 2021-06-09 ENCOUNTER — Other Ambulatory Visit: Payer: Self-pay

## 2021-06-09 ENCOUNTER — Other Ambulatory Visit: Payer: Self-pay | Admitting: Internal Medicine

## 2021-06-09 ENCOUNTER — Encounter: Payer: Self-pay | Admitting: Internal Medicine

## 2021-06-09 DIAGNOSIS — E559 Vitamin D deficiency, unspecified: Secondary | ICD-10-CM

## 2021-06-09 DIAGNOSIS — I10 Essential (primary) hypertension: Secondary | ICD-10-CM

## 2021-06-09 DIAGNOSIS — R7989 Other specified abnormal findings of blood chemistry: Secondary | ICD-10-CM

## 2021-06-09 DIAGNOSIS — E119 Type 2 diabetes mellitus without complications: Secondary | ICD-10-CM | POA: Diagnosis not present

## 2021-06-09 LAB — CBC WITH DIFFERENTIAL/PLATELET
Basophils Absolute: 0.1 10*3/uL (ref 0.0–0.1)
Basophils Relative: 0.9 % (ref 0.0–3.0)
Eosinophils Absolute: 0.4 10*3/uL (ref 0.0–0.7)
Eosinophils Relative: 7.6 % — ABNORMAL HIGH (ref 0.0–5.0)
HCT: 40 % (ref 36.0–46.0)
Hemoglobin: 13 g/dL (ref 12.0–15.0)
Lymphocytes Relative: 27.1 % (ref 12.0–46.0)
Lymphs Abs: 1.6 10*3/uL (ref 0.7–4.0)
MCHC: 32.3 g/dL (ref 30.0–36.0)
MCV: 90.5 fl (ref 78.0–100.0)
Monocytes Absolute: 0.5 10*3/uL (ref 0.1–1.0)
Monocytes Relative: 8 % (ref 3.0–12.0)
Neutro Abs: 3.2 10*3/uL (ref 1.4–7.7)
Neutrophils Relative %: 56.4 % (ref 43.0–77.0)
Platelets: 298 10*3/uL (ref 150.0–400.0)
RBC: 4.43 Mil/uL (ref 3.87–5.11)
RDW: 16.1 % — ABNORMAL HIGH (ref 11.5–15.5)
WBC: 5.7 10*3/uL (ref 4.0–10.5)

## 2021-06-09 LAB — LIPID PANEL
Cholesterol: 145 mg/dL (ref 0–200)
HDL: 47.3 mg/dL (ref >39.00)
LDL Cholesterol: 86 mg/dL (ref 0–99)
NonHDL: 98.09
Total CHOL/HDL Ratio: 3
Triglycerides: 62 mg/dL (ref 0.0–149.0)
VLDL: 12.4 mg/dL (ref 0.0–40.0)

## 2021-06-09 LAB — COMPREHENSIVE METABOLIC PANEL
ALT: 10 U/L (ref 0–35)
AST: 12 U/L (ref 0–37)
Albumin: 4 g/dL (ref 3.5–5.2)
Alkaline Phosphatase: 70 U/L (ref 39–117)
BUN: 9 mg/dL (ref 6–23)
CO2: 36 mEq/L — ABNORMAL HIGH (ref 19–32)
Calcium: 9.8 mg/dL (ref 8.4–10.5)
Chloride: 99 mEq/L (ref 96–112)
Creatinine, Ser: 0.82 mg/dL (ref 0.40–1.20)
GFR: 69.76 mL/min (ref >60.00)
Glucose, Bld: 91 mg/dL (ref 70–99)
Potassium: 3.8 mEq/L (ref 3.5–5.1)
Sodium: 138 mEq/L (ref 135–145)
Total Bilirubin: 0.5 mg/dL (ref 0.2–1.2)
Total Protein: 6.7 g/dL (ref 6.0–8.3)

## 2021-06-09 LAB — VITAMIN D 25 HYDROXY (VIT D DEFICIENCY, FRACTURES): VITD: 12.13 ng/mL — ABNORMAL LOW (ref 30.00–100.00)

## 2021-06-09 LAB — TSH: TSH: 3.84 u[IU]/mL (ref 0.35–5.50)

## 2021-06-09 LAB — HEMOGLOBIN A1C: Hgb A1c MFr Bld: 6.9 % — ABNORMAL HIGH (ref 4.6–6.5)

## 2021-06-09 MED ORDER — CHOLECALCIFEROL 1.25 MG (50000 UT) PO CAPS: 50000.0000 [IU] | ORAL_CAPSULE | ORAL | 1 refills | Status: DC

## 2021-06-09 NOTE — Addendum Note (Signed)
Addended by: Leeanne Rio on: 06/09/2021 08:48 AM ? ? Modules accepted: Orders ? ?

## 2021-06-18 DIAGNOSIS — E119 Type 2 diabetes mellitus without complications: Secondary | ICD-10-CM | POA: Diagnosis not present

## 2021-06-19 ENCOUNTER — Other Ambulatory Visit: Payer: Self-pay | Admitting: Internal Medicine

## 2021-06-19 DIAGNOSIS — B3731 Acute candidiasis of vulva and vagina: Secondary | ICD-10-CM

## 2021-06-19 LAB — URINALYSIS, ROUTINE W REFLEX MICROSCOPIC
Bilirubin Urine: NEGATIVE
Glucose, UA: NEGATIVE
Hgb urine dipstick: NEGATIVE
Ketones, ur: NEGATIVE
Nitrite: NEGATIVE
Protein, ur: NEGATIVE
RBC / HPF: NONE SEEN /HPF (ref 0–2)
Specific Gravity, Urine: 1.009 (ref 1.001–1.035)
pH: 5.5 (ref 5.0–8.0)

## 2021-06-19 LAB — MICROSCOPIC MESSAGE

## 2021-06-19 LAB — MICROALBUMIN / CREATININE URINE RATIO
Creatinine, Urine: 40 mg/dL (ref 20–275)
Microalb Creat Ratio: 15 mcg/mg creat (ref <30)
Microalb, Ur: 0.6 mg/dL

## 2021-06-19 MED ORDER — FLUCONAZOLE 150 MG PO TABS: 150.0000 mg | ORAL_TABLET | Freq: Once | ORAL | 0 refills | Status: AC

## 2021-06-24 ENCOUNTER — Telehealth: Payer: Self-pay | Admitting: Internal Medicine

## 2021-06-24 NOTE — Telephone Encounter (Signed)
Tracy Torres, CMA  ?06/24/2021  2:07 PM EDT Back to Top  ?  ?Left message to return call.  ? Tracy Jackson, MD  ?06/23/2021  3:00 PM EDT   ?  ?Yeast was seen in urine please take Diflucan  ?Sent   ? Tracy Torres, [place]  ?06/22/2021 10:14 AM EDT   ?  ?Informed pt in regards to labs. ?Rec diet and exercise.  ?She was made aware that Vit D has been sent to pharmacy for to take 1x weekly ?She denied any uti sx's  ?In regards to Diflucan did she have yeast? I did not see where that was sent in. Pt was asking clarification.  ? ?

## 2021-07-25 ENCOUNTER — Other Ambulatory Visit: Payer: Self-pay | Admitting: Internal Medicine

## 2021-07-25 DIAGNOSIS — I10 Essential (primary) hypertension: Secondary | ICD-10-CM

## 2021-08-09 IMAGING — MG DIGITAL SCREENING BILAT W/ TOMO W/ CAD
8 series · 8 of 24 positions shown · non-contrast
Comparison: Previous exam(s).

ACR Breast Density Category a: The breast tissue is almost entirely
fatty.

CLINICAL DATA: Screening.

EXAM:
DIGITAL SCREENING BILATERAL MAMMOGRAM WITH TOMO AND CAD

[R CC synth-2D]
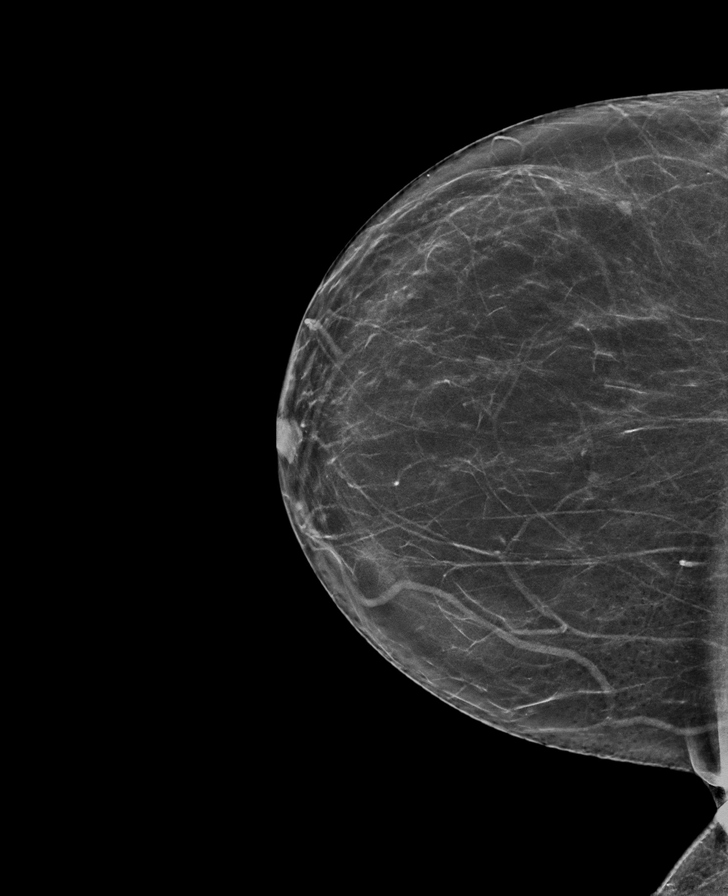

[R MLO synth-2D]
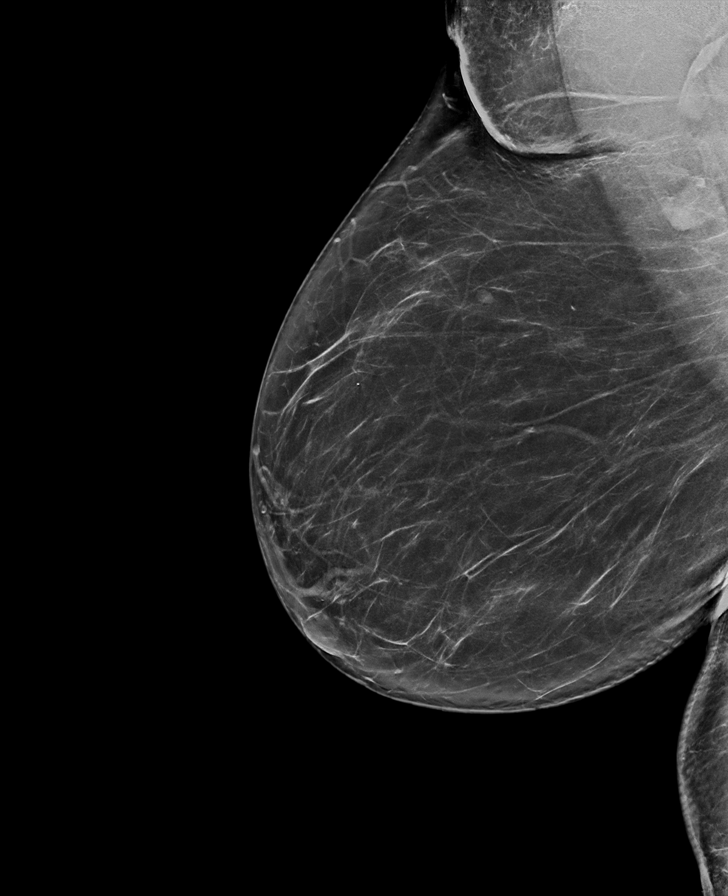

[L MLO synth-2D]
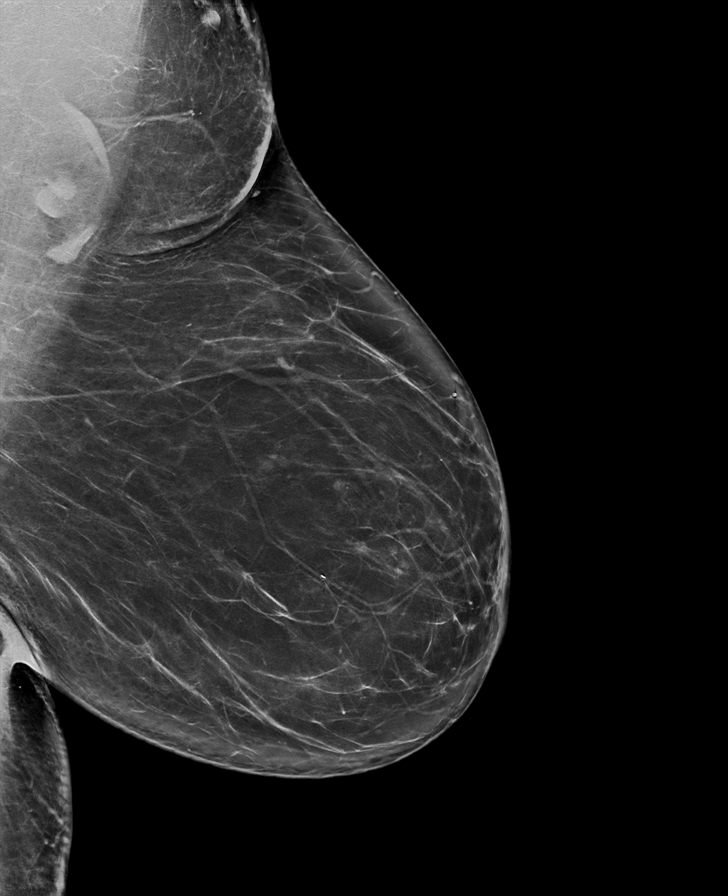

[L CC synth-2D]
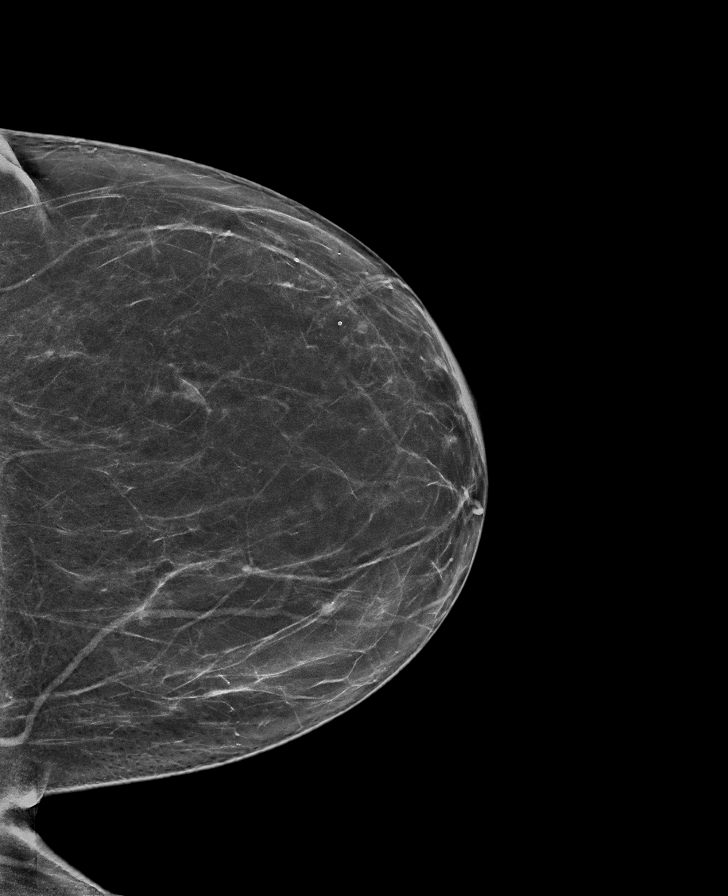

[R CC tomo · tomo slice 37/72.0]
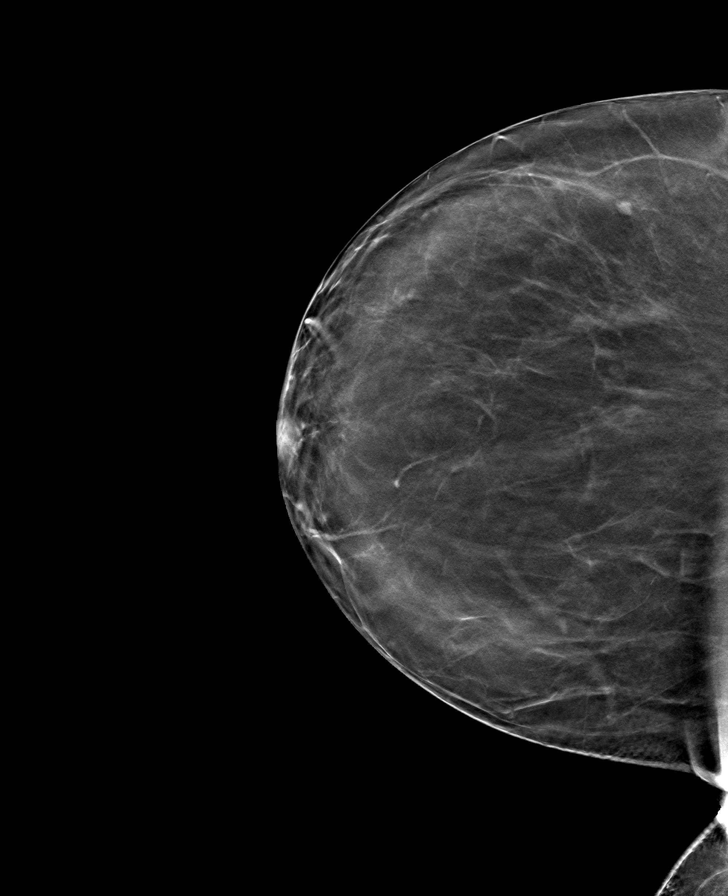

[L MLO tomo · tomo slice 43/85.0]
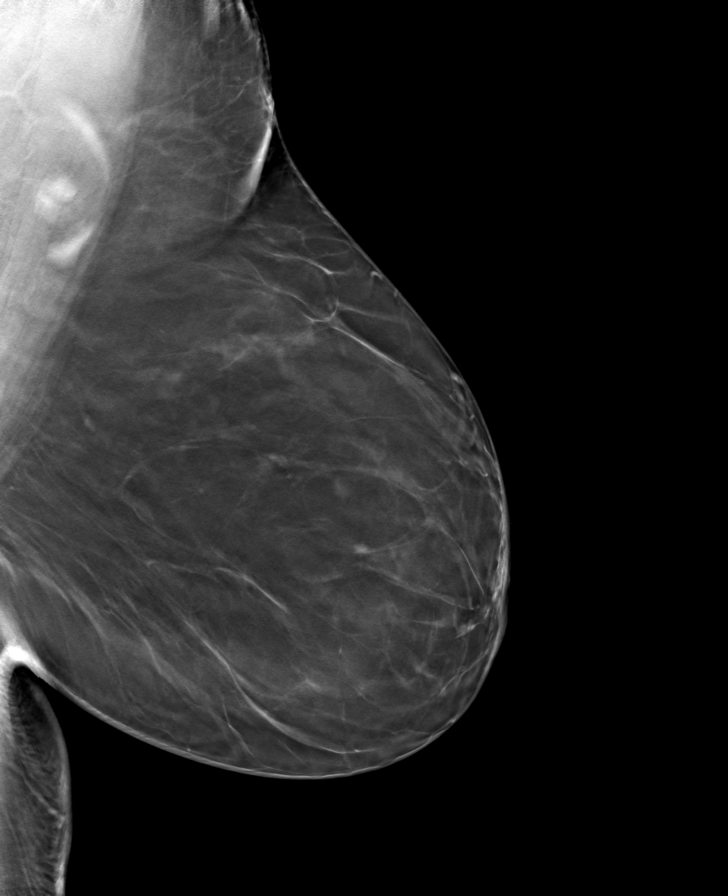

[R MLO tomo · tomo slice 43/84.0]
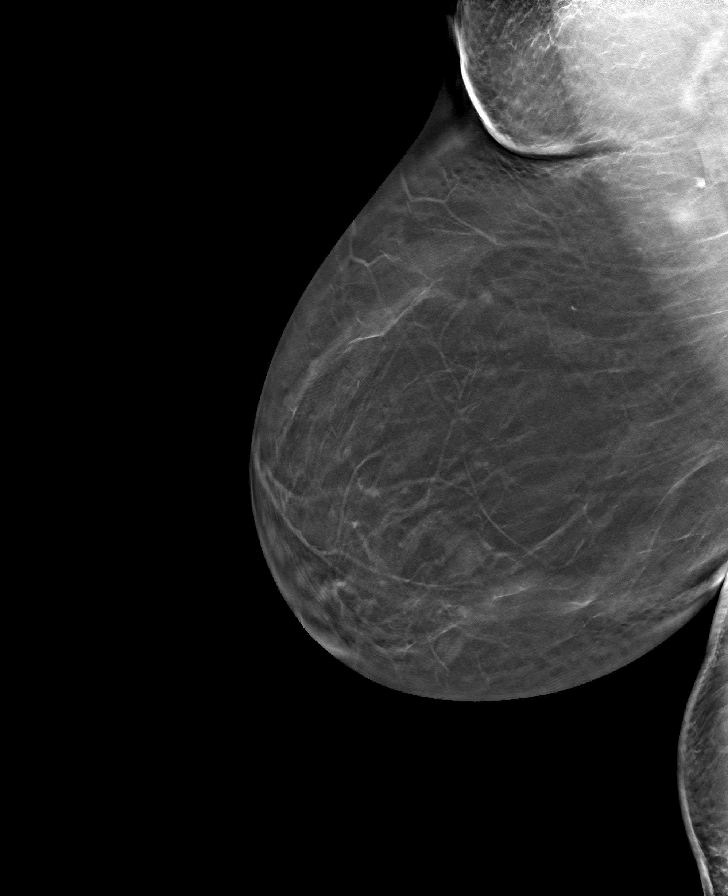

[L CC tomo · tomo slice 38/75.0]
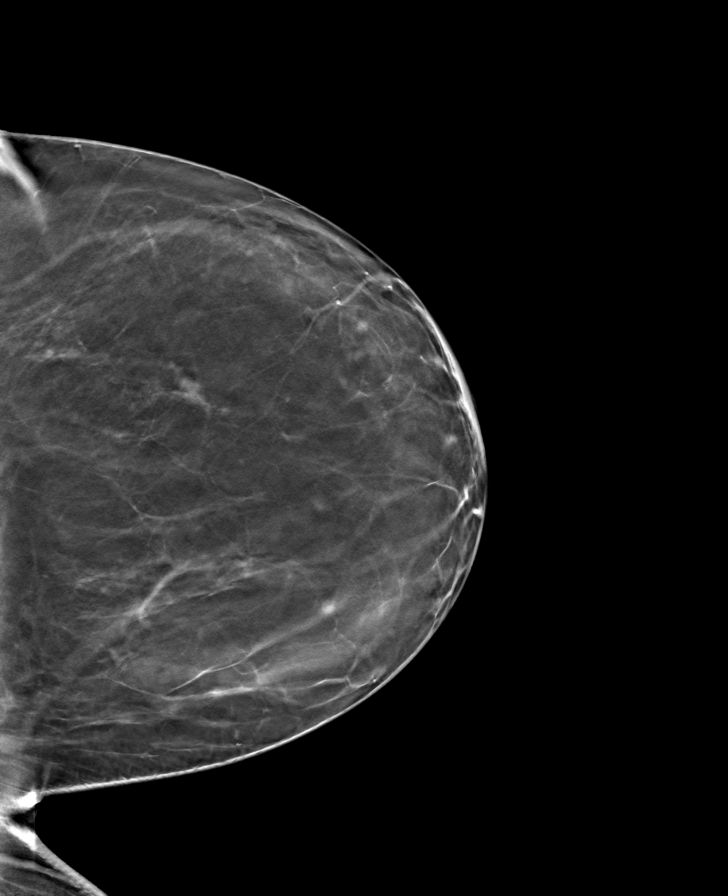

[8 of 24 positions shown; findings below may reference images not displayed]

FINDINGS: There are no findings suspicious for malignancy. Images were
processed with CAD.
IMPRESSION: No mammographic evidence of malignancy. A result letter of this
screening mammogram will be mailed directly to the patient.

RECOMMENDATION:
Screening mammogram in one year. (Code:[id number])

BI-RADS CATEGORY  1: Negative.

## 2021-09-16 ENCOUNTER — Other Ambulatory Visit: Payer: Self-pay | Admitting: Internal Medicine

## 2021-09-16 DIAGNOSIS — E119 Type 2 diabetes mellitus without complications: Secondary | ICD-10-CM

## 2021-09-16 DIAGNOSIS — E1159 Type 2 diabetes mellitus with other circulatory complications: Secondary | ICD-10-CM

## 2021-09-16 MED ORDER — ACCU-CHEK SMARTVIEW VI STRP: ORAL_STRIP | 11 refills | Status: AC

## 2021-09-16 MED ORDER — ACCU-CHEK SOFTCLIX LANCETS MISC: 12 refills | Status: AC

## 2021-10-28 ENCOUNTER — Ambulatory Visit: Payer: Medicare PPO | Admitting: Internal Medicine

## 2021-10-29 ENCOUNTER — Encounter: Payer: Self-pay | Admitting: Internal Medicine

## 2021-10-29 ENCOUNTER — Ambulatory Visit: Payer: Medicare PPO | Admitting: Internal Medicine

## 2021-10-29 VITALS — BP 120/70 | HR 69 | Temp 98.3°F | Ht 59.0 in | Wt 165.8 lb

## 2021-10-29 DIAGNOSIS — Z0000 Encounter for general adult medical examination without abnormal findings: Secondary | ICD-10-CM | POA: Diagnosis not present

## 2021-10-29 DIAGNOSIS — I517 Cardiomegaly: Secondary | ICD-10-CM

## 2021-10-29 DIAGNOSIS — I10 Essential (primary) hypertension: Secondary | ICD-10-CM

## 2021-10-29 DIAGNOSIS — E559 Vitamin D deficiency, unspecified: Secondary | ICD-10-CM

## 2021-10-29 DIAGNOSIS — E1159 Type 2 diabetes mellitus with other circulatory complications: Secondary | ICD-10-CM | POA: Diagnosis not present

## 2021-10-29 DIAGNOSIS — Z23 Encounter for immunization: Secondary | ICD-10-CM

## 2021-10-29 DIAGNOSIS — R600 Localized edema: Secondary | ICD-10-CM | POA: Diagnosis not present

## 2021-10-29 DIAGNOSIS — E119 Type 2 diabetes mellitus without complications: Secondary | ICD-10-CM | POA: Diagnosis not present

## 2021-10-29 DIAGNOSIS — I152 Hypertension secondary to endocrine disorders: Secondary | ICD-10-CM | POA: Diagnosis not present

## 2021-10-29 DIAGNOSIS — J4520 Mild intermittent asthma, uncomplicated: Secondary | ICD-10-CM | POA: Diagnosis not present

## 2021-10-29 DIAGNOSIS — I340 Nonrheumatic mitral (valve) insufficiency: Secondary | ICD-10-CM

## 2021-10-29 DIAGNOSIS — R0602 Shortness of breath: Secondary | ICD-10-CM

## 2021-10-29 MED ORDER — LOSARTAN POTASSIUM-HCTZ 100-25 MG PO TABS: 1.0000 | ORAL_TABLET | Freq: Every day | ORAL | 3 refills | Status: AC

## 2021-10-29 MED ORDER — CARVEDILOL 6.25 MG PO TABS: 6.2500 mg | ORAL_TABLET | Freq: Two times a day (BID) | ORAL | 3 refills | Status: AC

## 2021-10-29 MED ORDER — AMLODIPINE BESYLATE 10 MG PO TABS: ORAL_TABLET | ORAL | 3 refills | Status: AC

## 2021-10-29 MED ORDER — MONTELUKAST SODIUM 10 MG PO TABS: ORAL_TABLET | ORAL | 3 refills | Status: DC

## 2021-10-29 MED ORDER — ATORVASTATIN CALCIUM 80 MG PO TABS: 80.0000 mg | ORAL_TABLET | Freq: Every day | ORAL | 3 refills | Status: AC

## 2021-10-29 MED ORDER — BUDESONIDE-FORMOTEROL FUMARATE 160-4.5 MCG/ACT IN AERO: 2.0000 | INHALATION_SPRAY | Freq: Two times a day (BID) | RESPIRATORY_TRACT | 12 refills | Status: AC

## 2021-10-29 MED ORDER — TETANUS-DIPHTH-ACELL PERTUSSIS 5-2.5-18.5 LF-MCG/0.5 IM SUSP: 0.5000 mL | Freq: Once | INTRAMUSCULAR | 0 refills | Status: AC

## 2021-10-29 MED ORDER — METFORMIN HCL 1000 MG PO TABS: 1000.0000 mg | ORAL_TABLET | Freq: Every day | ORAL | 3 refills | Status: AC

## 2021-10-29 MED ORDER — ALBUTEROL SULFATE HFA 108 (90 BASE) MCG/ACT IN AERS: 1.0000 | INHALATION_SPRAY | Freq: Four times a day (QID) | RESPIRATORY_TRACT | 11 refills | Status: AC | PRN

## 2021-10-29 NOTE — Patient Instructions (Addendum)
Dr. Volanda Napoleon new PCP  Dr. Nicki Reaper, Dr. Derrel Nip   Will order echo at [facility]   Dr. Matilde Sprang make sure he faxes me the note [phone]   [facility]   [facility] [place], [place] [address]   [phone]   Demaris Callander, MD   [facility] [place], [place] [address]   [phone] (Work)   [phone] (Fax)     Venia Carbon. Leamon Arnt, MD, [place], [place] 5.0 2 Google reviews Oncologist in [employer], [place] Get online care: [email or web address] Address: [address] [place], [place] [address] Phone: [phone]  11/10/2020 Ancillary Orders Lab Demaris Callander, MD   [facility] [address]   [place], [place] [address]    [phone] (Work)   [phone] (Fax)      11/10/2020 Appointment Radiology Leamon Arnt, Jacqulynn Cadet, MD   [facility] [address]   [place], [place] [address]   [phone] (Work)   [phone] (Fax)      11/10/2020 Office Visit Oncology Demaris Callander, MD   [facility] [address]   [place], [place] [address]   [phone] (Work)   [phone] (Fax)     Vickii Chafe, NP   [facility]   [place], [place] [address]   [phone] (Work)   [phone] (Fax)      11/17/2020 Office Visit Oncology [place], Zane Herald, NP   [facility] [address]   [place],  [address]   [phone] (Work)   [phone] (Fax)

## 2021-10-29 NOTE — Progress Notes (Signed)
Chief Complaint  Patient presents with   Follow-up    6 month f/u   Annual Exam   Annual f/u  1. Htn controlled on coreg 6.25 mg bid hyzaar 100-25 mg qd norvasc 10 mg qd, lipitor 80, metformin 1000 mg qd  Dm 2 controlled  2. C/o b/l leg edema  3. 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 Leamon Arnt pt will call for appt imaging was to be done 11/2020 but this was not and will get scheduled asap  4. Brother died recently he live in [place] and 2nd brother to die and only 1 living sister    Review of Systems  Constitutional:  Negative for weight loss.  HENT:  Negative for hearing loss.   Eyes:  Negative for blurred vision.  Respiratory:  Negative for shortness of breath.   Cardiovascular:  Positive for leg swelling. Negative for chest pain.  Gastrointestinal:  Negative for abdominal pain and blood in stool.  Genitourinary:  Negative for dysuria.  Musculoskeletal:  Negative for falls and joint pain.  Skin:  Negative for rash.  Neurological:  Negative for headaches.  Psychiatric/Behavioral:  Negative for depression.    Past Medical History:  Diagnosis Date   Asthma    Chicken pox    CTS (carpal tunnel syndrome)    Diabetes ([place])    Fibroids    History of colon polyps    History of shingles    Hypercalcemia    resolved after extra calcium and hctz stopped    Hypercalcemia    07/2013 stopped calcium and normalized    Hypertension    Shingles    Sinus problem    Thyroid disease    abnormal thyroid tests TSH elevated 07/2014 Dr. Dionisio David   Vitamin D deficiency    Past Surgical History:  Procedure Laterality Date   arm surgery     left arm age [age] y.o    CATARACT EXTRACTION     right eye 09/08/18 Dr.Beavis   EYE SURGERY     TUBAL LIGATION     Family History  Problem Relation Age of Onset   Diabetes Mother    Heart disease Mother        MI   Hypertension Mother    Hyperlipidemia Mother    Stroke Mother    Alcohol abuse Father     Cancer Father    Hyperlipidemia Sister    Hypertension Sister    Alcohol abuse Brother    Early death Brother    Diabetes Brother    Alcohol abuse Brother    Cancer Brother    Depression Brother    Hyperlipidemia Brother    Breast cancer Neg Hx    Social History   Socioeconomic History   Marital status: Married    Spouse name: Not on file   Number of children: Not on file   Years of education: Not on file   Highest education level: Not on file  Occupational History   Not on file  Tobacco Use   Smoking status: Former   Smokeless tobacco: Never  Vaping Use   Vaping Use: Never used  Substance and Sexual Activity   Alcohol use: No   Drug use: No   Sexual activity: Yes    Comment: husband  Other Topics Concern   Not on file  Social History Narrative   1 year of college retired    Married    1 daughter summer in  [place] aged 25 as of 10/2018 and 1 son in [place]    Had grandson Vivia Budge 05/23/19 by daughter Summer   51 years married as of 10/2020    Social Determinants of Health   Financial Resource Strain: [place]  (10/13/2017)   Overall Financial Resource Strain (CARDIA)    Difficulty of Paying Living Expenses: Not hard at all  Food Insecurity: No Food Insecurity (10/13/2017)   Hunger Vital Sign    Worried About Running Out of Food in the Last Year: Never true    Ran Out of Food in the Last Year: Never true  Transportation Needs: No Transportation Needs (10/13/2017)   PRAPARE - Hydrologist (Medical): No    Lack of Transportation (Non-Medical): No  Physical Activity: Unknown (10/13/2017)   Exercise Vital Sign    Days of Exercise per Week: 0 days    Minutes of Exercise per Session: Not on file  Stress: No Stress Concern Present (10/13/2017)   [place]    Feeling of Stress : Not at all  Social Connections: Not on file  Intimate Partner Violence: Not At Risk  (10/13/2017)   Humiliation, Afraid, Rape, and Kick questionnaire    Fear of Current or Ex-Partner: No    Emotionally Abused: No    Physically Abused: No    Sexually Abused: No   Current Meds  Medication Sig   Accu-Chek Softclix Lancets lancets E 11.59 Use as instructed   Ascorbic Acid (VITAMIN C PO) Take 1 tablet by mouth daily.    blood glucose meter kit and supplies Dispense based on patient and insurance preference. Use two times daily as directed. (FOR ICD-10 E11.9).   chlorpheniramine-HYDROcodone (TUSSIONEX PENNKINETIC ER) 10-8 MG/5ML Take 5 mLs by mouth at bedtime as needed.   Cholecalciferol 1.25 MG (50000 UT) capsule Take 1 capsule (50,000 Units total) by mouth once a week.   glucose blood (ACCU-CHEK SMARTVIEW) test strip USE 1 STRIP DAILY E11.59   Lancets (ACCU-CHEK SOFT TOUCH) lancets FILL FASTCLIX LANCETS 102s Check 1x per day Use as instructed E11.9   Tdap (BOOSTRIX) 5-2.5-18.5 LF-MCG/0.5 injection Inject 0.5 mLs into the muscle once for 1 dose.   [DISCONTINUED] albuterol (VENTOLIN HFA) 108 (90 Base) MCG/ACT inhaler Inhale 1-2 puffs into the lungs every 6 (six) hours as needed for wheezing or shortness of breath.   [DISCONTINUED] amLODipine (NORVASC) 10 MG tablet TAKE 1 TABLET(10 MG) BY MOUTH DAILY   [DISCONTINUED] atorvastatin (LIPITOR) 80 MG tablet Take 1 tablet (80 mg total) by mouth daily.   [DISCONTINUED] budesonide-formoterol (SYMBICORT) 160-4.5 MCG/ACT inhaler Inhale 2 puffs into the lungs 2 (two) times daily. Rinse mouth subsitution ok   [DISCONTINUED] carvedilol (COREG) 6.25 MG tablet Take 1 tablet (6.25 mg total) by mouth 2 (two) times daily with a meal.   [DISCONTINUED] losartan-hydrochlorothiazide (HYZAAR) 100-25 MG tablet TAKE 1 TABLET BY MOUTH DAILY   [DISCONTINUED] metFORMIN (GLUCOPHAGE) 1000 MG tablet Take 1 tablet (1,000 mg total) by mouth daily with breakfast.   [DISCONTINUED] montelukast (SINGULAIR) 10 MG tablet TAKE 1 TABLET(10 MG) BY MOUTH DAILY AT NIGHT   No  Known Allergies No results found for this or any previous visit (from the past 2160 hour(s)). Objective  Body mass index is 33.49 kg/m. Wt Readings from Last 3 Encounters:  10/29/21 165 lb 12.8 oz (75.2 kg)  04/30/21 162 lb (73.5 kg)  01/21/21 168 lb 6.4 oz (76.4 kg)   Temp Readings from  Last 3 Encounters:  10/29/21 98.3 F (36.8 C) (Oral)  04/30/21 98.2 F (36.8 C) (Oral)  01/21/21 97.9 F (36.6 C) (Oral)   BP Readings from Last 3 Encounters:  10/29/21 120/70  04/30/21 118/80  01/21/21 100/60   Pulse Readings from Last 3 Encounters:  10/29/21 69  01/21/21 81  08/08/20 74    Physical Exam Vitals and nursing note reviewed.  Constitutional:      Appearance: Normal appearance. She is well-developed and well-groomed. She is obese.  HENT:     Head: Normocephalic and atraumatic.  Eyes:     Conjunctiva/sclera: Conjunctivae normal.     Pupils: Pupils are equal, round, and reactive to light.  Cardiovascular:     Rate and Rhythm: Normal rate and regular rhythm.     Heart sounds: Normal heart sounds. No murmur heard. Pulmonary:     Effort: Pulmonary effort is normal.     Breath sounds: Normal breath sounds.  Abdominal:     General: Abdomen is flat. Bowel sounds are normal.     Tenderness: There is no abdominal tenderness.  Musculoskeletal:        General: No tenderness.  Skin:    General: Skin is warm and dry.  Neurological:     General: No focal deficit present.     Mental Status: She is alert and oriented to person, place, and time. Mental status is at baseline.     Cranial Nerves: Cranial nerves 2-12 are intact.     Motor: Motor function is intact.     Coordination: Coordination is intact.     Gait: Gait is intact.  Psychiatric:        Attention and Perception: Attention and perception normal.        Mood and Affect: Mood and affect normal.        Speech: Speech normal.        Behavior: Behavior normal. Behavior is cooperative.        Thought Content: Thought  content normal.        Cognition and Memory: Cognition and memory normal.        Judgment: Judgment normal.     Assessment  Plan  Annual physical exam See below  Vitamin D deficiency - Plan: Vitamin D (25 hydroxy)  Hypertension associated with diabetes ([place]) - Plan: Hemoglobin A1c, CBC w/Diff, Comprehensive metabolic panel, ECHOCARDIOGRAM COMPLETE, CANCELED: Basic Metabolic Panel (BMET) oreg 6.25 mg bid hyzaar 100-25 mg qd norvasc 10 mg qd, lipitor 80, metformin 1000 mg qd  Eye exam Dr. Jacinto Reap nice upcoming  Foot exam today normal overally   Mild intermittent asthma without complication - Plan: albuterol (VENTOLIN HFA) 108 (90 Base) MCG/ACT inhaler, budesonide-formoterol (SYMBICORT) 160-4.5 MCG/ACT inhaler, montelukast (SINGULAIR) 10 MG tablet   SOB (shortness of breath) on exertion - Plan: ECHOCARDIOGRAM COMPLETE Leg edema ? If norvasc related do echo to look at cardiac etiology- Plan: ECHOCARDIOGRAM COMPLETE  Can try elevation   Hm Flu shot 12/2020 pna 23 utd  Pfizer 4/4 utd consider booster Consider Tdap in future given Rx today prevnar had 12/18/15, pna 23 utd   Prevnar 20 consider 01/23/2024   shingrix 2/2 doses utd  rec hep B vaccine in the past negative hep B ab qualitative 12/18/15    hcv neg 08/19/15    Pap neg 07/2014 [facility] and out of age window pap    08/07/20 neg mammogram negative referred today 08/07/21 pt needs to call and schedule mammogram     colonoscopy h/o polyps  she did do cologuard 01/08/16 negative but with h/o polyps colonoscopy preferred she is agreeable in the future seen [facility] in the past  FIT test 04/03/2019 negative  05/21/20 cologaurd negative   DEXA normal 07/2014    Former smoker quit 40 years ago low risk and does not meet criteria see below [facility] CT scans [facility]    rec healthy diet and exercise    MRI 05/12/20 abdomen  2. Slightly increased size of the left inferior pole renal mass concerning  for renal cell carcinoma.    Electronically  Reviewed by:  Joya Salm, MD, [facility]  Electronically Reviewed on:  05/12/2020 4:07 PM      05/12/20  Ct chest  IMPRESSION:  Multiple bilateral variably sized solid noncalcified pulmonary nodules  measuring up to 1.0 cm, largest in the left lower lobe, are not  significantly changed. Similar appearance of diffuse mild mosaic  attenuation throughout the lungs. 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     Provider: Dr. Olivia Mackie McLean-Scocuzza-Internal Medicine

## 2021-10-30 LAB — CBC WITH DIFFERENTIAL/PLATELET
Basophils Absolute: 0.1 10*3/uL (ref 0.0–0.1)
Basophils Relative: 1.2 % (ref 0.0–3.0)
Eosinophils Absolute: 0.2 10*3/uL (ref 0.0–0.7)
Eosinophils Relative: 4.4 % (ref 0.0–5.0)
HCT: 39 % (ref 36.0–46.0)
Hemoglobin: 12.5 g/dL (ref 12.0–15.0)
Lymphocytes Relative: 24.4 % (ref 12.0–46.0)
Lymphs Abs: 1.3 10*3/uL (ref 0.7–4.0)
MCHC: 32 g/dL (ref 30.0–36.0)
MCV: 90.2 fl (ref 78.0–100.0)
Monocytes Absolute: 0.5 10*3/uL (ref 0.1–1.0)
Monocytes Relative: 9 % (ref 3.0–12.0)
Neutro Abs: 3.2 10*3/uL (ref 1.4–7.7)
Neutrophils Relative %: 61 % (ref 43.0–77.0)
Platelets: 276 10*3/uL (ref 150.0–400.0)
RBC: 4.32 Mil/uL (ref 3.87–5.11)
RDW: 15.6 % — ABNORMAL HIGH (ref 11.5–15.5)
WBC: 5.3 10*3/uL (ref 4.0–10.5)

## 2021-10-30 LAB — HEMOGLOBIN A1C: Hgb A1c MFr Bld: 6.9 % — ABNORMAL HIGH (ref 4.6–6.5)

## 2021-10-30 LAB — COMPREHENSIVE METABOLIC PANEL
ALT: 13 U/L (ref 0–35)
AST: 14 U/L (ref 0–37)
Albumin: 4.2 g/dL (ref 3.5–5.2)
Alkaline Phosphatase: 53 U/L (ref 39–117)
BUN: 12 mg/dL (ref 6–23)
CO2: 30 mEq/L (ref 19–32)
Calcium: 10.1 mg/dL (ref 8.4–10.5)
Chloride: 97 mEq/L (ref 96–112)
Creatinine, Ser: 0.89 mg/dL (ref 0.40–1.20)
GFR: 63.06 mL/min (ref >60.00)
Glucose, Bld: 84 mg/dL (ref 70–99)
Potassium: 3.9 mEq/L (ref 3.5–5.1)
Sodium: 136 mEq/L (ref 135–145)
Total Bilirubin: 0.6 mg/dL (ref 0.2–1.2)
Total Protein: 7.2 g/dL (ref 6.0–8.3)

## 2021-10-30 LAB — VITAMIN D 25 HYDROXY (VIT D DEFICIENCY, FRACTURES): VITD: 51.56 ng/mL (ref 30.00–100.00)

## 2021-10-30 NOTE — Progress Notes (Signed)
Note has been faxed to Dr. Darin Engels office.

## 2021-11-04 DIAGNOSIS — H524 Presbyopia: Secondary | ICD-10-CM | POA: Diagnosis not present

## 2021-11-04 DIAGNOSIS — H5203 Hypermetropia, bilateral: Secondary | ICD-10-CM | POA: Diagnosis not present

## 2021-11-04 DIAGNOSIS — H35371 Puckering of macula, right eye: Secondary | ICD-10-CM | POA: Diagnosis not present

## 2021-11-04 DIAGNOSIS — H3561 Retinal hemorrhage, right eye: Secondary | ICD-10-CM | POA: Diagnosis not present

## 2021-11-04 DIAGNOSIS — Z7984 Long term (current) use of oral hypoglycemic drugs: Secondary | ICD-10-CM | POA: Diagnosis not present

## 2021-11-04 DIAGNOSIS — Z961 Presence of intraocular lens: Secondary | ICD-10-CM | POA: Diagnosis not present

## 2021-11-04 DIAGNOSIS — H52223 Regular astigmatism, bilateral: Secondary | ICD-10-CM | POA: Diagnosis not present

## 2021-11-04 DIAGNOSIS — E119 Type 2 diabetes mellitus without complications: Secondary | ICD-10-CM | POA: Diagnosis not present

## 2021-11-20 DIAGNOSIS — H35372 Puckering of macula, left eye: Secondary | ICD-10-CM | POA: Diagnosis not present

## 2021-11-20 DIAGNOSIS — E113213 Type 2 diabetes mellitus with mild nonproliferative diabetic retinopathy with macular edema, bilateral: Secondary | ICD-10-CM | POA: Diagnosis not present

## 2021-11-20 DIAGNOSIS — H43822 Vitreomacular adhesion, left eye: Secondary | ICD-10-CM | POA: Diagnosis not present

## 2021-11-20 DIAGNOSIS — H26493 Other secondary cataract, bilateral: Secondary | ICD-10-CM | POA: Diagnosis not present

## 2021-11-30 LAB — HM DIABETES EYE EXAM

## 2021-12-01 ENCOUNTER — Encounter: Payer: Self-pay | Admitting: Internal Medicine

## 2021-12-10 ENCOUNTER — Ambulatory Visit: Admission: RE | Admit: 2021-12-10 | Payer: Medicare PPO | Source: Ambulatory Visit

## 2021-12-23 ENCOUNTER — Ambulatory Visit
Admission: RE | Admit: 2021-12-23 | Discharge: 2021-12-23 | Disposition: A | Discharge status: Home / Self Care | Payer: Medicare PPO | Source: Ambulatory Visit | Attending: Internal Medicine | Admitting: Internal Medicine

## 2021-12-23 DIAGNOSIS — E1159 Type 2 diabetes mellitus with other circulatory complications: Secondary | ICD-10-CM | POA: Insufficient documentation

## 2021-12-23 DIAGNOSIS — R0602 Shortness of breath: Secondary | ICD-10-CM

## 2021-12-23 DIAGNOSIS — I152 Hypertension secondary to endocrine disorders: Secondary | ICD-10-CM | POA: Insufficient documentation

## 2021-12-23 DIAGNOSIS — R600 Localized edema: Secondary | ICD-10-CM | POA: Insufficient documentation

## 2021-12-23 LAB — ECHOCARDIOGRAM COMPLETE
AR max vel: 1.66 cm2
AV Area VTI: 1.73 cm2
AV Area mean vel: 1.61 cm2
AV Mean grad: 5 mmHg
AV Peak grad: 9.7 mmHg
Ao pk vel: 1.56 m/s
Area-P 1/2: 3.63 cm2
S' Lateral: 2.3 cm

## 2021-12-23 NOTE — Progress Notes (Signed)
*  PRELIMINARY RESULTS* Echocardiogram 2D Echocardiogram has been performed.  Tracy Torres 12/23/2021, 11:44 AM

## 2021-12-24 DIAGNOSIS — I517 Cardiomegaly: Secondary | ICD-10-CM | POA: Insufficient documentation

## 2021-12-24 DIAGNOSIS — I340 Nonrheumatic mitral (valve) insufficiency: Secondary | ICD-10-CM | POA: Insufficient documentation

## 2021-12-24 NOTE — Addendum Note (Signed)
Addended by: Orland Mustard on: 12/24/2021 05:24 PM   Modules accepted: Orders

## 2022-02-22 ENCOUNTER — Other Ambulatory Visit: Payer: Self-pay

## 2022-02-22 DIAGNOSIS — E559 Vitamin D deficiency, unspecified: Secondary | ICD-10-CM

## 2022-02-22 MED ORDER — CHOLECALCIFEROL 1.25 MG (50000 UT) PO CAPS: 50000.0000 [IU] | ORAL_CAPSULE | ORAL | 1 refills | Status: AC

## 2022-03-04 ENCOUNTER — Ambulatory Visit: Payer: Medicare PPO | Admitting: Internal Medicine

## 2022-03-09 ENCOUNTER — Ambulatory Visit: Payer: Medicare PPO | Admitting: Cardiology

## 2022-04-19 ENCOUNTER — Encounter: Payer: Medicare PPO | Admitting: Family Medicine

## 2022-04-20 ENCOUNTER — Encounter: Payer: Medicare PPO | Admitting: Family Medicine

## 2022-04-20 DIAGNOSIS — I517 Cardiomegaly: Secondary | ICD-10-CM

## 2022-04-20 DIAGNOSIS — D3A090 Benign carcinoid tumor of the bronchus and lung: Secondary | ICD-10-CM

## 2022-04-20 DIAGNOSIS — K5790 Diverticulosis of intestine, part unspecified, without perforation or abscess without bleeding: Secondary | ICD-10-CM

## 2022-04-20 DIAGNOSIS — K219 Gastro-esophageal reflux disease without esophagitis: Secondary | ICD-10-CM

## 2022-04-20 DIAGNOSIS — K7460 Unspecified cirrhosis of liver: Secondary | ICD-10-CM

## 2022-04-20 DIAGNOSIS — I340 Nonrheumatic mitral (valve) insufficiency: Secondary | ICD-10-CM

## 2022-04-20 DIAGNOSIS — E119 Type 2 diabetes mellitus without complications: Secondary | ICD-10-CM

## 2022-04-20 DIAGNOSIS — J4520 Mild intermittent asthma, uncomplicated: Secondary | ICD-10-CM

## 2022-04-20 DIAGNOSIS — I10 Essential (primary) hypertension: Secondary | ICD-10-CM

## 2022-04-20 DIAGNOSIS — K760 Fatty (change of) liver, not elsewhere classified: Secondary | ICD-10-CM

## 2022-04-20 DIAGNOSIS — I152 Hypertension secondary to endocrine disorders: Secondary | ICD-10-CM

## 2022-04-22 ENCOUNTER — Encounter: Payer: Medicare PPO | Admitting: Family Medicine

## 2022-04-26 ENCOUNTER — Ambulatory Visit: Payer: Medicare PPO | Admitting: Cardiology

## 2022-05-05 ENCOUNTER — Ambulatory Visit: Payer: Medicare PPO | Admitting: Cardiology

## 2022-05-10 ENCOUNTER — Other Ambulatory Visit: Payer: Self-pay

## 2022-05-10 DIAGNOSIS — J4520 Mild intermittent asthma, uncomplicated: Secondary | ICD-10-CM

## 2022-05-10 MED ORDER — MONTELUKAST SODIUM 10 MG PO TABS: ORAL_TABLET | ORAL | 3 refills | Status: AC

## 2022-06-15 ENCOUNTER — Ambulatory Visit: Payer: Medicare PPO | Admitting: Cardiovascular Disease

## 2022-08-04 ENCOUNTER — Encounter: Payer: Self-pay | Admitting: Cardiology

## 2022-08-04 ENCOUNTER — Ambulatory Visit: Payer: Medicare PPO | Attending: Internal Medicine | Admitting: Cardiology

## 2022-08-04 VITALS — BP 128/70 | HR 75 | Ht 59.5 in | Wt 158.2 lb

## 2022-08-04 DIAGNOSIS — I10 Essential (primary) hypertension: Secondary | ICD-10-CM | POA: Diagnosis not present

## 2022-08-04 DIAGNOSIS — I340 Nonrheumatic mitral (valve) insufficiency: Secondary | ICD-10-CM

## 2022-08-04 NOTE — Patient Instructions (Signed)
Medication Instructions:   Your physician recommends that you continue on your current medications as directed. Please refer to the Current Medication list given to you today.  *If you need a refill on your cardiac medications before your next appointment, please call your pharmacy*   Lab Work:  None Ordered  If you have labs (blood work) drawn today and your tests are completely normal, you will receive your results only by: MyChart Message (if you have MyChart) OR A paper copy in the mail If you have any lab test that is abnormal or we need to change your treatment, we will call you to review the results.   Testing/Procedures:  None Ordered   Follow-Up: At [facility], you and your health needs are our priority.  As part of our continuing mission to provide you with exceptional heart care, we have created designated Provider Care Teams.  These Care Teams include your primary Cardiologist (physician) and Advanced Practice Providers (APPs -  Physician Assistants and Nurse Practitioners) who all work together to provide you with the care you need, when you need it.  We recommend signing up for the patient portal called "MyChart".  Sign up information is provided on this After Visit Summary.  MyChart is used to connect with patients for Virtual Visits (Telemedicine).  Patients are able to view lab/test results, encounter notes, upcoming appointments, etc.  Non-urgent messages can be sent to your provider as well.   To learn more about what you can do with MyChart, go to [email or web address].    Your next appointment:  AS NEEDED 

## 2022-08-04 NOTE — Progress Notes (Signed)
Cardiology Office Note:    Date:  08/04/2022   ID:  Tracy Torres, DOB [birth year], MRN [id number]  PCP:  Dana Allan, MD   [facility] Providers Cardiologist:  None     Referring MD: McLean-Scocuzza, French Ana *   Chief Complaint  Patient presents with   New Patient (Initial Visit)    Ref by McLean-Scocuzza for Mitral valve insufficiency, unspecified etiology. Medications reviewed by the patient verbally. Patient c/o shortness of breath with exertion.     History of Present Illness:    Tracy Torres is a 77 y.o. female with a hx of hypertension, diabetes, trivial MR who presents due to mitral valve regurgitation.  She had an echo 12/2021 due to symptoms of dyspnea.  Echocardiogram showed normal EF 65%, normal diastolic function, trivial MR, moderate RV thickness, normal RV function.  She denies chest pain, has occasional shortness of breath which she attributes to asthma.  Her blood pressures are well-controlled.  She overall feels well, denies edema, has no concerns at this time.  Past Medical History:  Diagnosis Date   Asthma    Chicken pox    CTS (carpal tunnel syndrome)    Diabetes ([facility])    Fibroids    History of colon polyps    History of shingles    Hypercalcemia    resolved after extra calcium and hctz stopped    Hypercalcemia    07/2013 stopped calcium and normalized    Hypertension    Shingles    Sinus problem    Thyroid disease    abnormal thyroid tests TSH elevated 07/2014 Dr. Thad Ranger   Vitamin D deficiency     Past Surgical History:  Procedure Laterality Date   arm surgery     left arm age [age] y.o    CATARACT EXTRACTION     right eye 09/08/18 Dr.Beavis   EYE SURGERY     TUBAL LIGATION      Current Medications: Current Meds  Medication Sig   Accu-Chek Softclix Lancets lancets E 11.59 Use as instructed   albuterol (VENTOLIN HFA) 108 (90 Base) MCG/ACT inhaler Inhale 1-2 puffs into the lungs every 6 (six) hours as needed for wheezing or  shortness of breath.   amLODipine (NORVASC) 10 MG tablet TAKE 1 TABLET(10 MG) BY MOUTH DAILY   Ascorbic Acid (VITAMIN C PO) Take 1 tablet by mouth daily.    atorvastatin (LIPITOR) 80 MG tablet Take 1 tablet (80 mg total) by mouth daily.   blood glucose meter kit and supplies Dispense based on patient and insurance preference. Use two times daily as directed. (FOR ICD-10 E11.9).   budesonide-formoterol (SYMBICORT) 160-4.5 MCG/ACT inhaler Inhale 2 puffs into the lungs 2 (two) times daily. Rinse mouth subsitution ok   carvedilol (COREG) 6.25 MG tablet Take 1 tablet (6.25 mg total) by mouth 2 (two) times daily with a meal.   chlorpheniramine-HYDROcodone (TUSSIONEX PENNKINETIC ER) 10-8 MG/5ML Take 5 mLs by mouth at bedtime as needed.   Cholecalciferol 1.25 MG (50000 UT) capsule Take 1 capsule (50,000 Units total) by mouth once a week.   glucose blood (ACCU-CHEK SMARTVIEW) test strip USE 1 STRIP DAILY E11.59   Lancets (ACCU-CHEK SOFT TOUCH) lancets FILL FASTCLIX LANCETS 102s Check 1x per day Use as instructed E11.9   losartan-hydrochlorothiazide (HYZAAR) 100-25 MG tablet Take 1 tablet by mouth daily.   metFORMIN (GLUCOPHAGE) 1000 MG tablet Take 1 tablet (1,000 mg total) by mouth daily with breakfast.   montelukast (SINGULAIR) 10 MG tablet  TAKE 1 TABLET(10 MG) BY MOUTH DAILY AT NIGHT   WIXELA INHUB 250-50 MCG/ACT AEPB 1 puff 2 (two) times daily.     Allergies:   Patient has no known allergies.   Social History   Socioeconomic History   Marital status: Married    Spouse name: Not on file   Number of children: Not on file   Years of education: Not on file   Highest education level: Not on file  Occupational History   Not on file  Tobacco Use   Smoking status: Former    Packs/day: 2.00    Years: 12.00    Additional pack years: 0.00    Total pack years: 24.00    Types: Cigarettes    Quit date: 05/17/1981    Years since quitting: 41.2   Smokeless tobacco: Never  Vaping Use   Vaping Use:  Never used  Substance and Sexual Activity   Alcohol use: No   Drug use: No   Sexual activity: Yes    Comment: husband  Other Topics Concern   Not on file  Social History Narrative   1 year of college retired    Married    1 daughter summer in [place] aged 66 as of 10/2018 and 1 son in [place]    Had grandson Darden Palmer 05/23/19 by daughter Summer   51 years married as of 10/2020    Social Determinants of Health   Financial Resource Strain: Low Risk  (10/13/2017)   Overall Financial Resource Strain (CARDIA)    Difficulty of Paying Living Expenses: Not hard at all  Food Insecurity: No Food Insecurity (10/13/2017)   Hunger Vital Sign    Worried About Running Out of Food in the Last Year: Never true    Ran Out of Food in the Last Year: Never true  Transportation Needs: No Transportation Needs (10/13/2017)   PRAPARE - Administrator, Civil Service (Medical): No    Lack of Transportation (Non-Medical): No  Physical Activity: Unknown (10/13/2017)   Exercise Vital Sign    Days of Exercise per Week: 0 days    Minutes of Exercise per Session: Not on file  Stress: No Stress Concern Present (10/13/2017)   [employer] of Occupational Health - Occupational Stress Questionnaire    Feeling of Stress : Not at all  Social Connections: Not on file     Family History: The patient's family history includes Alcohol abuse in her brother, brother, and father; Cancer in her brother and father; Depression in her brother; Diabetes in her brother and mother; Early death in her brother; Heart disease in her mother; Hyperlipidemia in her brother, mother, and sister; Hypertension in her mother and sister; Stroke in her mother. There is no history of Breast cancer.  ROS:   Please see the history of present illness.     All other systems reviewed and are negative.  EKGs/Labs/Other Studies Reviewed:    The following studies were reviewed today:   EKG:  EKG is  ordered today.  The ekg  ordered today demonstrates normal sinus rhythm, normal ECG  Recent Labs: 10/29/2021: ALT 13; BUN 12; Creatinine, Ser 0.89; Hemoglobin 12.5; Platelets 276.0; Potassium 3.9; Sodium 136  Recent Lipid Panel    Component Value Date/Time   CHOL 145 06/09/2021 0822   CHOL 152 01/19/2019 0942   TRIG 62.0 06/09/2021 0822   HDL 47.30 06/09/2021 0822   HDL 40 01/19/2019 0942   CHOLHDL 3 06/09/2021 0822   VLDL 12.4  06/09/2021 0822   LDLCALC 86 06/09/2021 0822   LDLCALC 96 01/19/2019 0942     Risk Assessment/Calculations:             Physical Exam:    VS:  BP 128/70 (BP Location: Right Arm, Patient Position: Sitting, Cuff Size: Normal)   Pulse 75   Ht 4' 11.5" (1.511 m)   Wt 158 lb 4 oz (71.8 kg)   SpO2 93%   BMI 31.43 kg/m     Wt Readings from Last 3 Encounters:  08/04/22 158 lb 4 oz (71.8 kg)  10/29/21 165 lb 12.8 oz (75.2 kg)  04/30/21 162 lb (73.5 kg)     GEN:  Well nourished, well developed in no acute distress HEENT: Normal NECK: No JVD; No carotid bruits CARDIAC: RRR, no murmurs, rubs, gallops RESPIRATORY:  Clear to auscultation without rales, wheezing or rhonchi  ABDOMEN: Soft, non-tender, non-distended MUSCULOSKELETAL:  No edema; No deformity  SKIN: Warm and dry NEUROLOGIC:  Alert and oriented x 3 PSYCHIATRIC:  Normal affect   ASSESSMENT:    1. Mitral valve insufficiency, unspecified etiology   2. Primary hypertension    PLAN:    In order of problems listed above:  Trivial MR on echo 12/2021.  Patient could be physiology.  Normal systolic and diastolic function, EF 65%.  Normal RV function.  No indication for additional testing or management for trivial MR.  Patient made aware of results, reassured. Hypertension, BP controlled.  Continue Norvasc, Coreg, Hyzaar.  Follow-up as needed      Medication Adjustments/Labs and Tests Ordered: Current medicines are reviewed at length with the patient today.  Concerns regarding medicines are outlined above.  Orders  Placed This Encounter  Procedures   EKG 12-Lead   No orders of the defined types were placed in this encounter.   Patient Instructions  Medication Instructions:   Your physician recommends that you continue on your current medications as directed. Please refer to the Current Medication list given to you today.  *If you need a refill on your cardiac medications before your next appointment, please call your pharmacy*   Lab Work:  None Ordered  If you have labs (blood work) drawn today and your tests are completely normal, you will receive your results only by: MyChart Message (if you have MyChart) OR A paper copy in the mail If you have any lab test that is abnormal or we need to change your treatment, we will call you to review the results.   Testing/Procedures:  None Ordered   Follow-Up: At [facility], you and your health needs are our priority.  As part of our continuing mission to provide you with exceptional heart care, we have created designated Provider Care Teams.  These Care Teams include your primary Cardiologist (physician) and Advanced Practice Providers (APPs -  Physician Assistants and Nurse Practitioners) who all work together to provide you with the care you need, when you need it.  We recommend signing up for the patient portal called "MyChart".  Sign up information is provided on this After Visit Summary.  MyChart is used to connect with patients for Virtual Visits (Telemedicine).  Patients are able to view lab/test results, encounter notes, upcoming appointments, etc.  Non-urgent messages can be sent to your provider as well.   To learn more about what you can do with MyChart, go to [email or web address].    Your next appointment:    AS NEEDED    Signed, Debbe Odea, MD  08/04/2022  11:48 AM     [facility]

## 2022-08-17 ENCOUNTER — Other Ambulatory Visit: Payer: Self-pay

## 2022-10-08 ENCOUNTER — Telehealth: Payer: Self-pay

## 2022-10-08 NOTE — Telephone Encounter (Signed)
noted 

## 2022-10-08 NOTE — Telephone Encounter (Signed)
Pt returned Shanda Bumps CMA call. Note below was read to her. Pt stated that she found another doctor since she never got to meet [facility]. She already have her med from doctor.

## 2022-10-08 NOTE — Telephone Encounter (Signed)
We received a refill request for pt's Losartan/hydrochlorothiazide. Pt has not been seen since last year by Dr. French Ana. I left a message with pt to give our office a call back to schedule an appt.
